# Patient Record
Sex: Female | Born: 1965 | Race: Black or African American | Hispanic: No | Marital: Married | Smoking: Never smoker
Health system: Southern US, Community
[De-identification: ages and names within clinical notes are randomized; demographics above are authoritative.]

## PROBLEM LIST (undated history)

## (undated) DIAGNOSIS — I10 Essential (primary) hypertension: Secondary | ICD-10-CM

## (undated) DIAGNOSIS — R7303 Prediabetes: Secondary | ICD-10-CM

## (undated) DIAGNOSIS — M1711 Unilateral primary osteoarthritis, right knee: Secondary | ICD-10-CM

## (undated) DIAGNOSIS — L680 Hirsutism: Secondary | ICD-10-CM

## (undated) DIAGNOSIS — M2550 Pain in unspecified joint: Secondary | ICD-10-CM

## (undated) DIAGNOSIS — B009 Herpesviral infection, unspecified: Secondary | ICD-10-CM

## (undated) DIAGNOSIS — E669 Obesity, unspecified: Secondary | ICD-10-CM

## (undated) DIAGNOSIS — E282 Polycystic ovarian syndrome: Secondary | ICD-10-CM

## (undated) HISTORY — DX: Essential (primary) hypertension: I10

## (undated) HISTORY — DX: Prediabetes: R73.03

## (undated) HISTORY — PX: WISDOM TOOTH EXTRACTION: SHX21

## (undated) HISTORY — DX: Polycystic ovarian syndrome: E28.2

## (undated) HISTORY — DX: Herpesviral infection, unspecified: B00.9

## (undated) HISTORY — DX: Pain in unspecified joint: M25.50

## (undated) HISTORY — DX: Hirsutism: L68.0

## (undated) HISTORY — DX: Unilateral primary osteoarthritis, right knee: M17.11

## (undated) HISTORY — DX: Obesity, unspecified: E66.9

---

## 2000-07-06 ENCOUNTER — Other Ambulatory Visit: Admission: RE | Admit: 2000-07-06 | Discharge: 2000-07-06 | Payer: Self-pay | Admitting: Obstetrics and Gynecology

## 2001-08-09 ENCOUNTER — Other Ambulatory Visit: Admission: RE | Admit: 2001-08-09 | Discharge: 2001-08-09 | Payer: Self-pay | Admitting: Obstetrics and Gynecology

## 2002-09-20 ENCOUNTER — Other Ambulatory Visit: Admission: RE | Admit: 2002-09-20 | Discharge: 2002-09-20 | Payer: Self-pay | Admitting: Obstetrics and Gynecology

## 2002-09-29 ENCOUNTER — Ambulatory Visit (HOSPITAL_COMMUNITY): Admission: RE | Admit: 2002-09-29 | Discharge: 2002-09-29 | Payer: Self-pay | Admitting: Obstetrics and Gynecology

## 2002-09-29 ENCOUNTER — Encounter: Payer: Self-pay | Admitting: Obstetrics and Gynecology

## 2003-09-20 ENCOUNTER — Other Ambulatory Visit: Admission: RE | Admit: 2003-09-20 | Discharge: 2003-09-20 | Payer: Self-pay | Admitting: Obstetrics and Gynecology

## 2003-11-20 ENCOUNTER — Ambulatory Visit (HOSPITAL_COMMUNITY): Admission: RE | Admit: 2003-11-20 | Discharge: 2003-11-20 | Payer: Self-pay | Admitting: Obstetrics and Gynecology

## 2003-12-07 ENCOUNTER — Inpatient Hospital Stay (HOSPITAL_COMMUNITY): Admission: AD | Admit: 2003-12-07 | Discharge: 2003-12-07 | Payer: Self-pay | Admitting: Obstetrics and Gynecology

## 2004-02-28 ENCOUNTER — Ambulatory Visit (HOSPITAL_COMMUNITY): Admission: RE | Admit: 2004-02-28 | Discharge: 2004-02-28 | Payer: Self-pay | Admitting: Obstetrics and Gynecology

## 2004-03-06 ENCOUNTER — Ambulatory Visit (HOSPITAL_COMMUNITY): Admission: RE | Admit: 2004-03-06 | Discharge: 2004-03-06 | Payer: Self-pay | Admitting: Obstetrics and Gynecology

## 2004-03-10 ENCOUNTER — Ambulatory Visit (HOSPITAL_COMMUNITY): Admission: RE | Admit: 2004-03-10 | Discharge: 2004-03-10 | Payer: Self-pay | Admitting: Obstetrics and Gynecology

## 2004-03-18 ENCOUNTER — Encounter: Admission: RE | Admit: 2004-03-18 | Discharge: 2004-04-04 | Payer: Self-pay | Admitting: Obstetrics and Gynecology

## 2004-03-27 ENCOUNTER — Ambulatory Visit (HOSPITAL_COMMUNITY): Admission: RE | Admit: 2004-03-27 | Discharge: 2004-03-27 | Payer: Self-pay | Admitting: Obstetrics and Gynecology

## 2004-04-01 ENCOUNTER — Ambulatory Visit (HOSPITAL_COMMUNITY): Admission: RE | Admit: 2004-04-01 | Discharge: 2004-04-01 | Payer: Self-pay | Admitting: Obstetrics and Gynecology

## 2004-04-04 ENCOUNTER — Inpatient Hospital Stay (HOSPITAL_COMMUNITY): Admission: RE | Admit: 2004-04-04 | Discharge: 2004-04-07 | Payer: Self-pay | Admitting: Obstetrics and Gynecology

## 2004-10-08 ENCOUNTER — Other Ambulatory Visit: Admission: RE | Admit: 2004-10-08 | Discharge: 2004-10-08 | Payer: Self-pay | Admitting: Obstetrics and Gynecology

## 2005-11-23 ENCOUNTER — Other Ambulatory Visit: Admission: RE | Admit: 2005-11-23 | Discharge: 2005-11-23 | Payer: Self-pay | Admitting: Obstetrics and Gynecology

## 2005-12-22 IMAGING — US US FETAL BPP W/O NONSTRESS
1 series · 14 of 18 positions shown · non-contrast
Comparison: none

CLINICAL DATA: Nonreactive NST with hypertension.  Assess fetal well-being.

[Series 1: us fetal bpp w/o nonstress · 0.37mm/px · 14 of 18 slices shown]
[im 1/18]
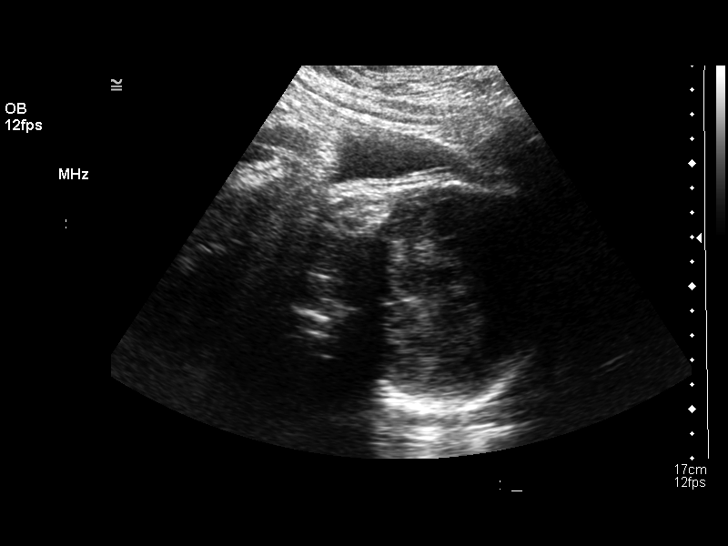
[im 2/18]
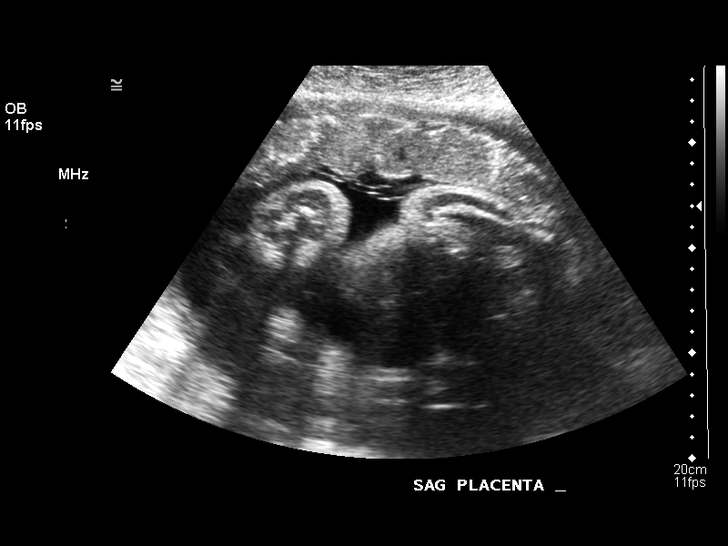
[im 4/18]
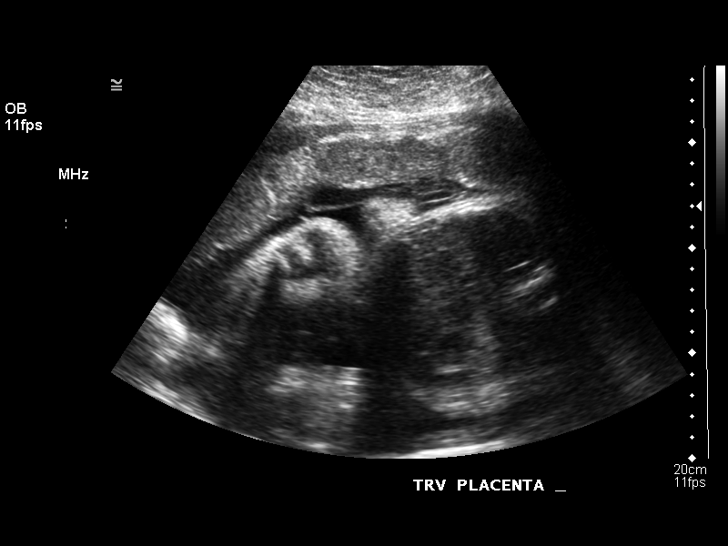
[im 5/18]
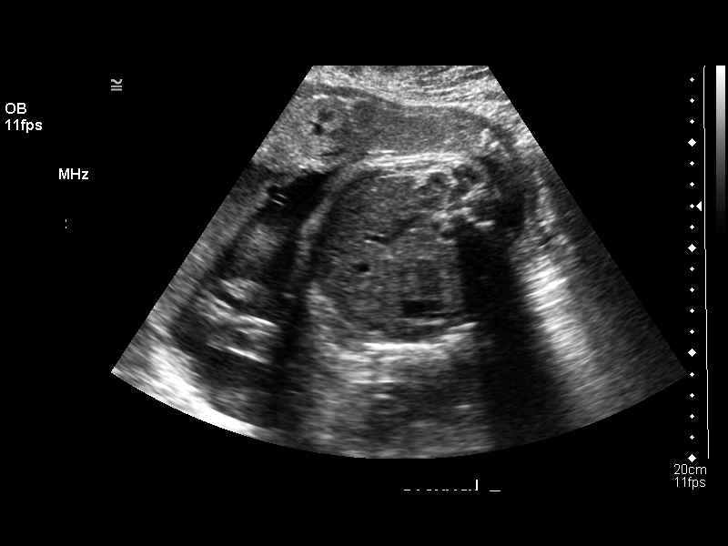
[im 6/18]
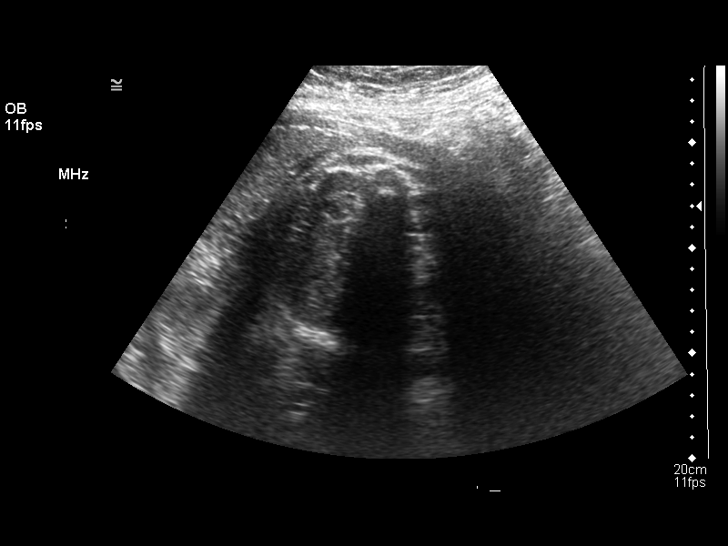
[im 8/18]
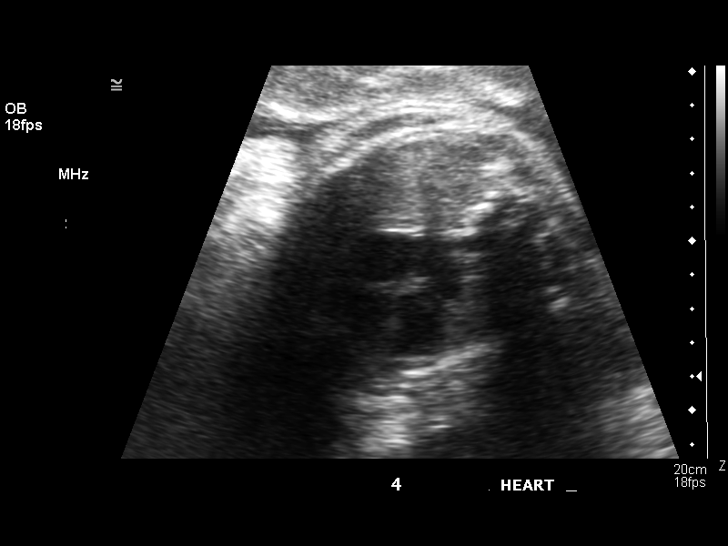
[im 9/18]
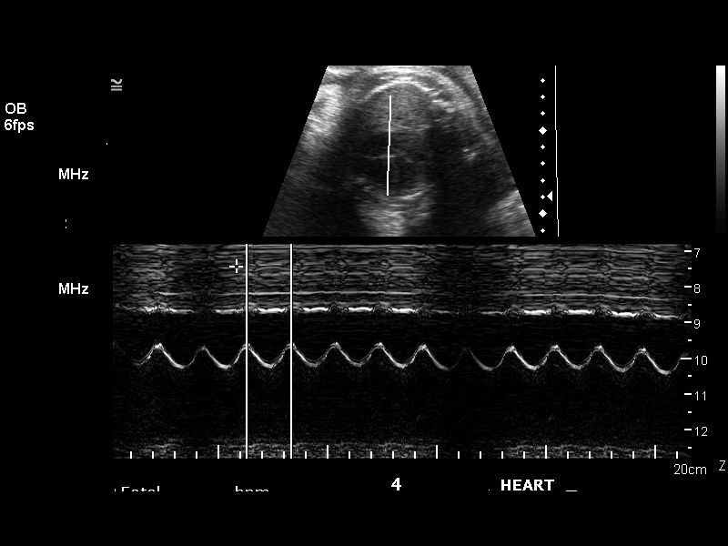
[im 10/18]
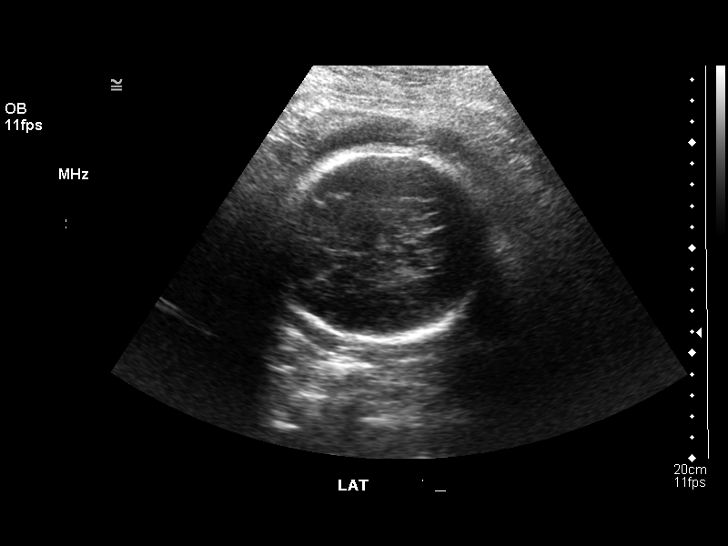
[im 11/18]
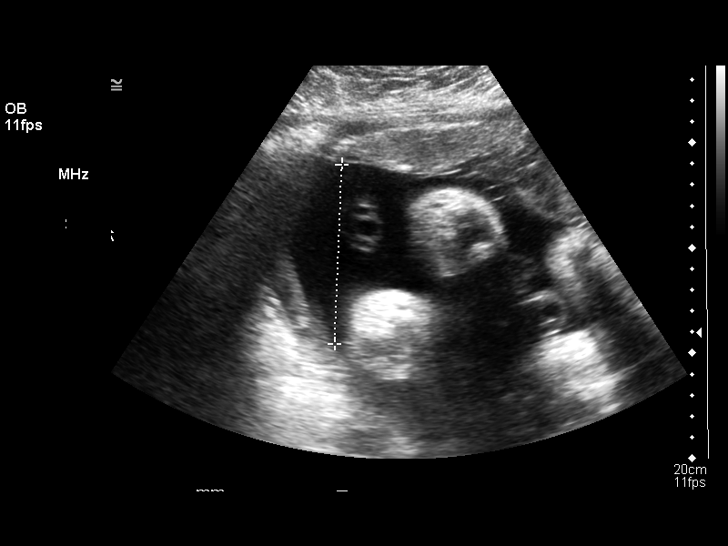
[im 13/18]
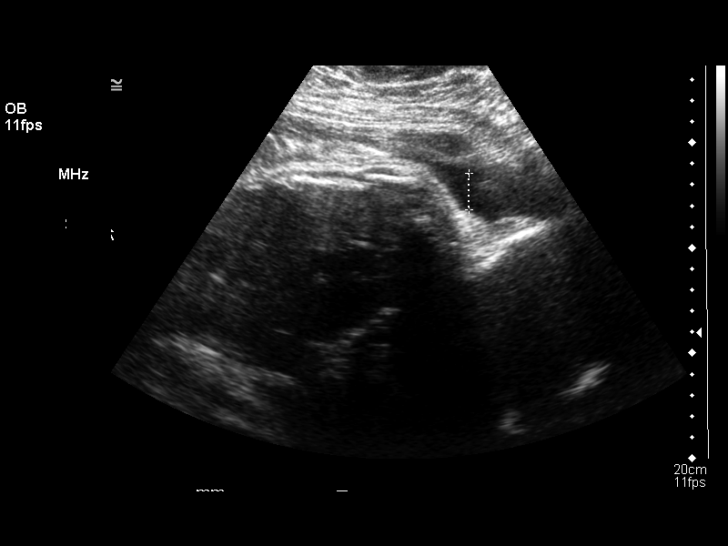
[im 14/18]
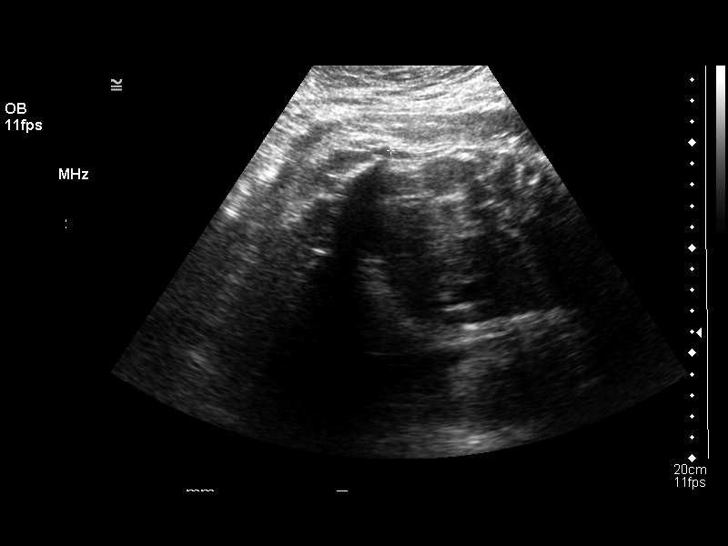
[im 15/18]
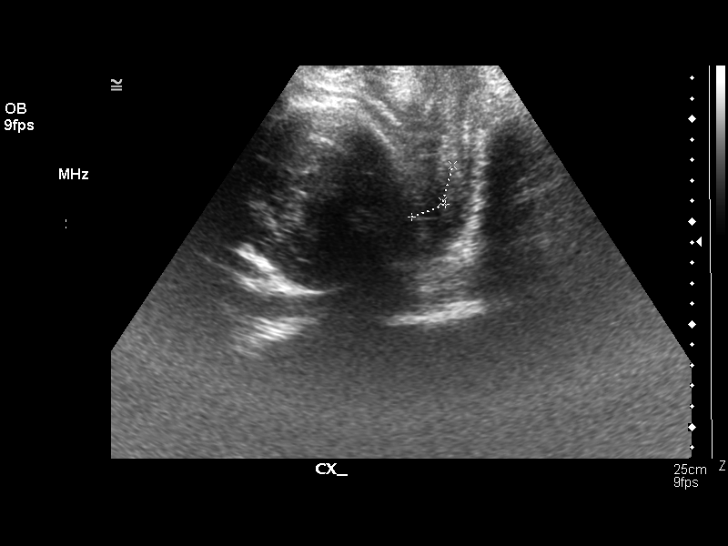
[im 17/18]
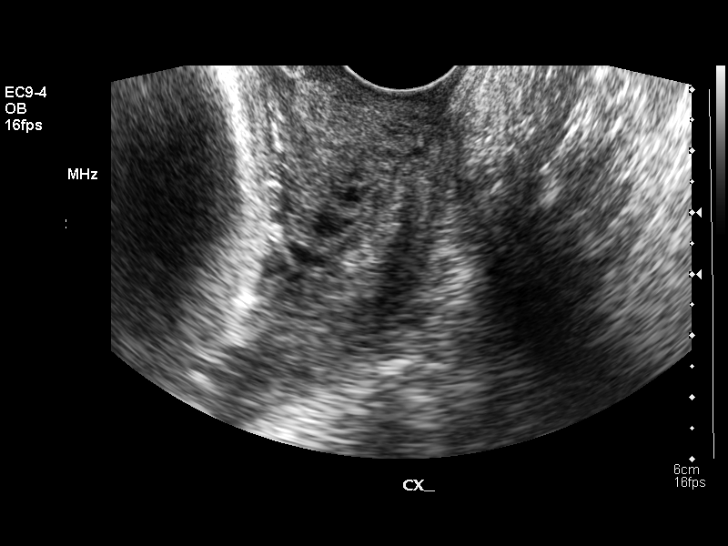
[im 18/18]
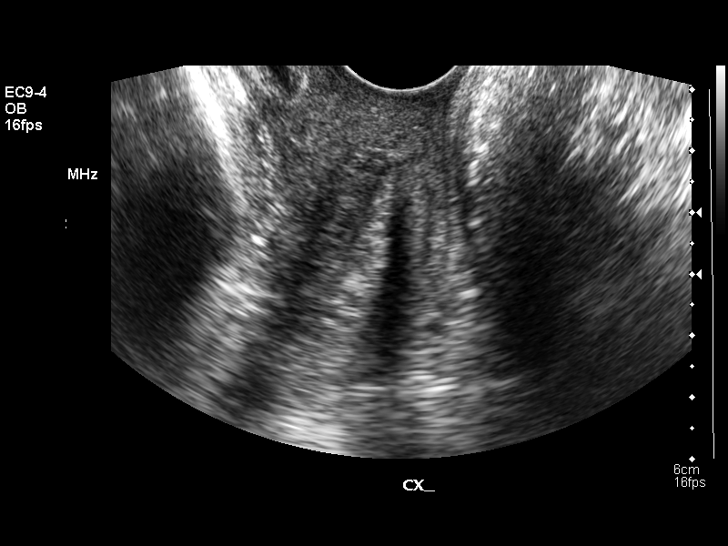

[14 of 18 positions shown; findings below may reference images not displayed]

BIOPHYSICAL PROFILE WITH TRANSVAGINAL:

Number of Fetuses:  1
Heart rate:  147
Movement:  Yes
Breathing:  Yes
Presentation:  Cephalic
Placental Location:  Anterior
Grade:  III
Previa:  No
Amniotic Fluid   (Subjective):  Normal
Amniotic Fluid (Objective):  13.3 cm AFI (5th -95th%ile = 7.9 ? 24.9 cm for 35 wks)

Fetal measurements and complete anatomic evaluation were not requested.  The following fetal anatomy was visualized on this exam:  Lateral ventricles, four chamber heart, stomach, kidneys and bladder. 

BPP SCORING
Movements:  2  Time:  20 minutes
Breathing:  2
Tone:  2
Amniotic Fluid:  2
Total Score:  8

MATERNAL UTERINE AND ADNEXAL FINDINGS
Cervix:  3.0 cm Transvaginally
IMPRESSION: 1.  Biophysical profile score [DATE].
2.  Subjectively and quantitatively normal amniotic fluid volume and normal cervical length.
3.  No late developing fetal anatomic abnormalities are identified associated with the lateral ventricles, four chamber heart, stomach, kidneys or bladder.

## 2005-12-26 IMAGING — US US FETAL BPP W/O NONSTRESS
1 series · 14 of 23 positions shown · non-contrast
Comparison: none

BIOPHYSICAL PROFILE

 Number of Fetuses:  1
 Heart rate:  157
 Movement:  Yes
 Breathing:  No
 Presentation:  Cephalic
 Placental Location:  Anterior
 Grade:  III
 Previa:  No
 Amniotic Fluid (Subjective):  Normal
 Amniotic Fluid (Objective):  22.7 cm AFI (5th -95th%ile = 7.9 ? 24.9 cm for 35 wks)
 Fetal measurements and complete anatomic evaluation were not requested.  The following fetal anatomy was visualized on this exam:  Stomach, kidneys and bladder.
 BPP SCORING
 Movements:  2  Time:  30 minutes
 Breathing:  0
 Tone:  2
 Amniotic Fluid:  2
 Total Score:  6
 MATERNAL UTERINE AND ADNEXAL FINDINGS
 Cervix:  3.3 cm Transvaginally

[Series 1: us fetal bpp w/o nonstress · 0.54mm/px · 14 of 23 slices shown]
[im 1/23]
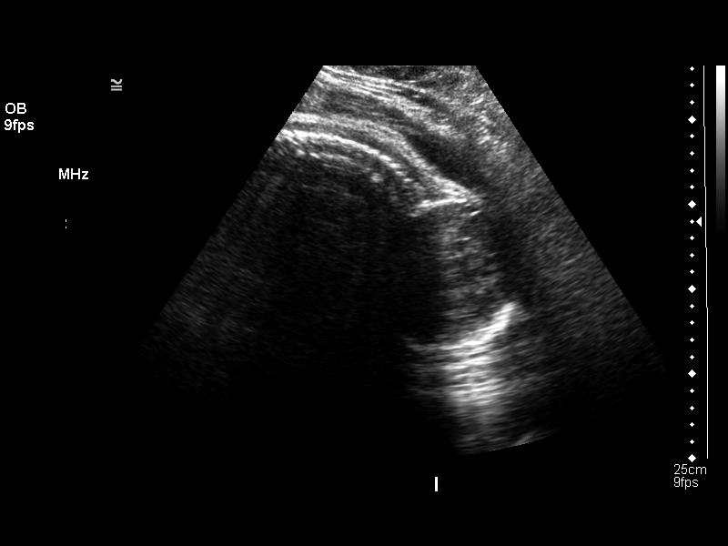
[im 3/23]
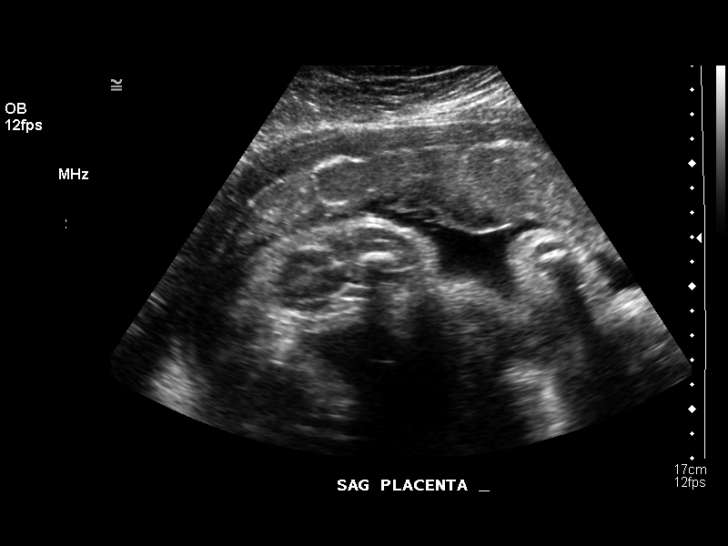
[im 5/23]
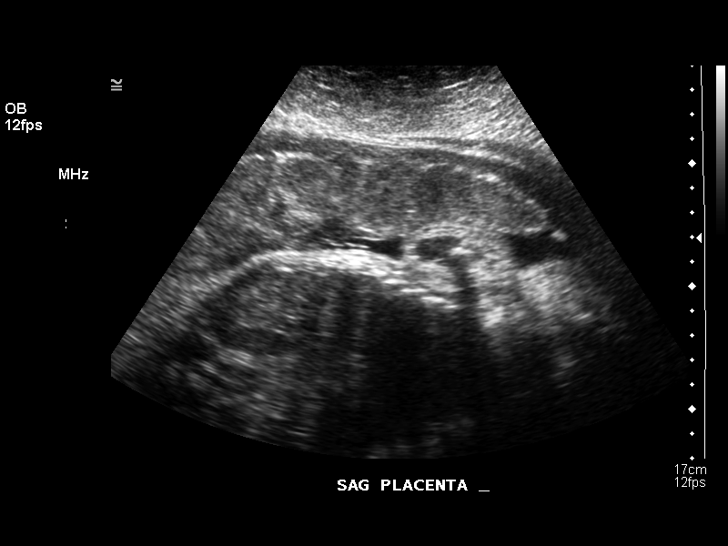
[im 6/23]
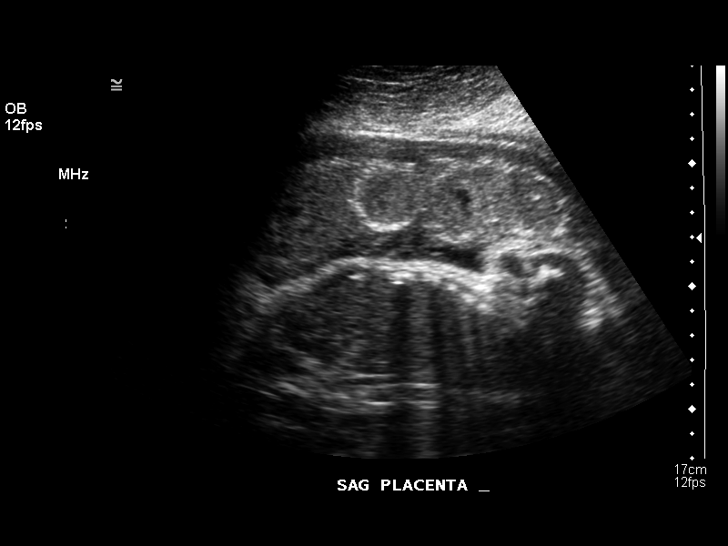
[im 8/23]
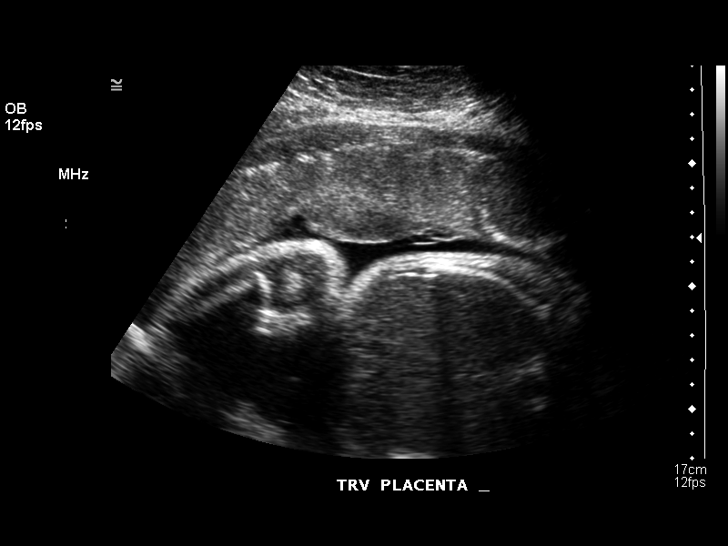
[im 10/23]
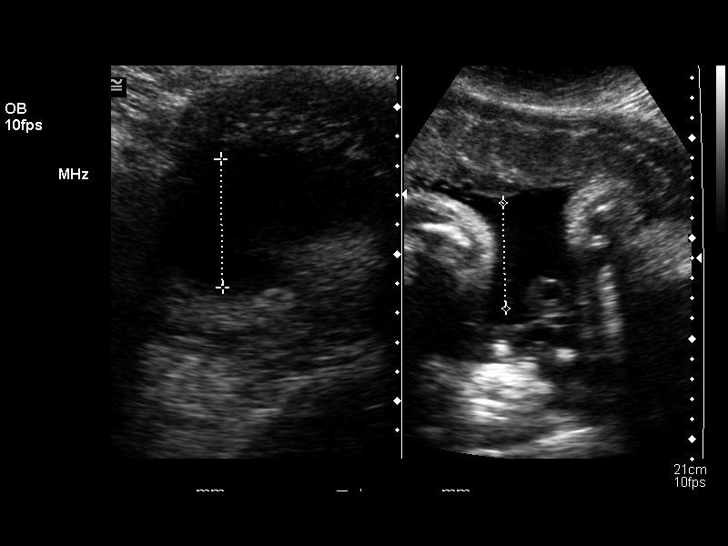
[im 11/23]
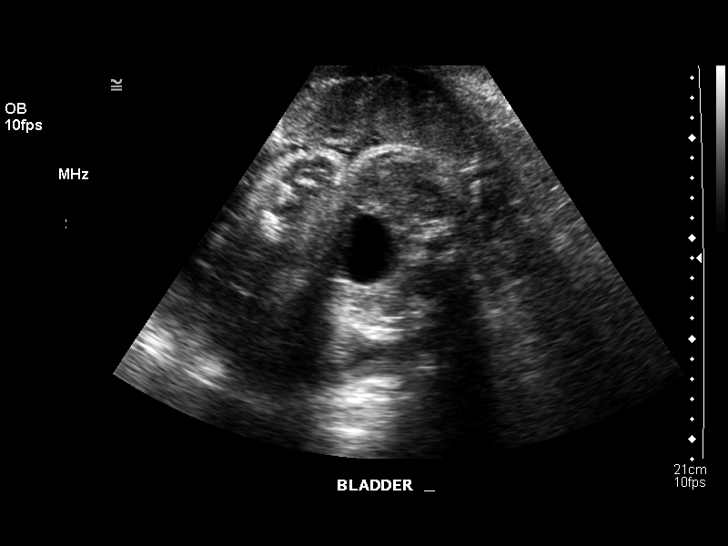
[im 13/23]
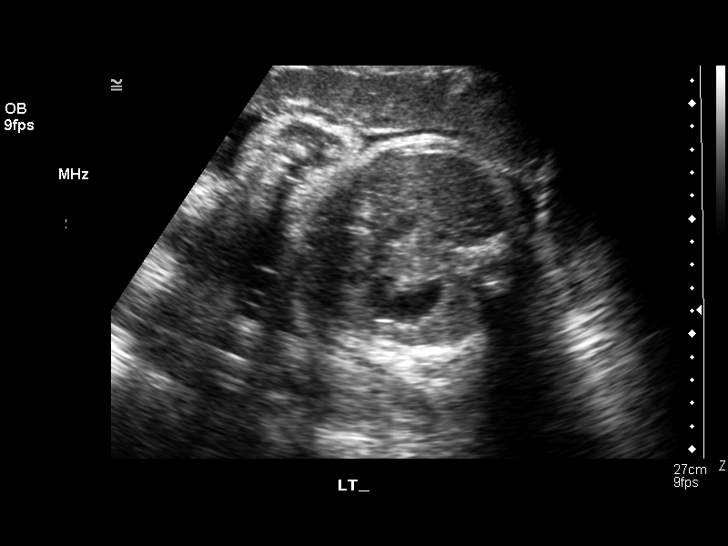
[im 14/23]
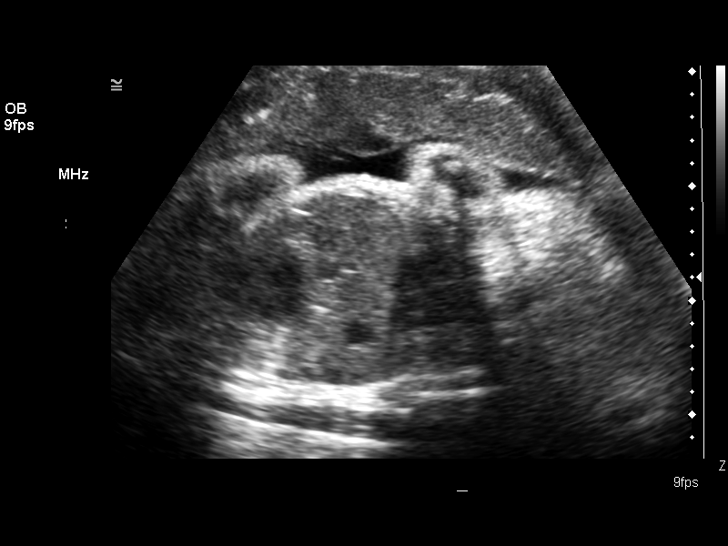
[im 16/23]
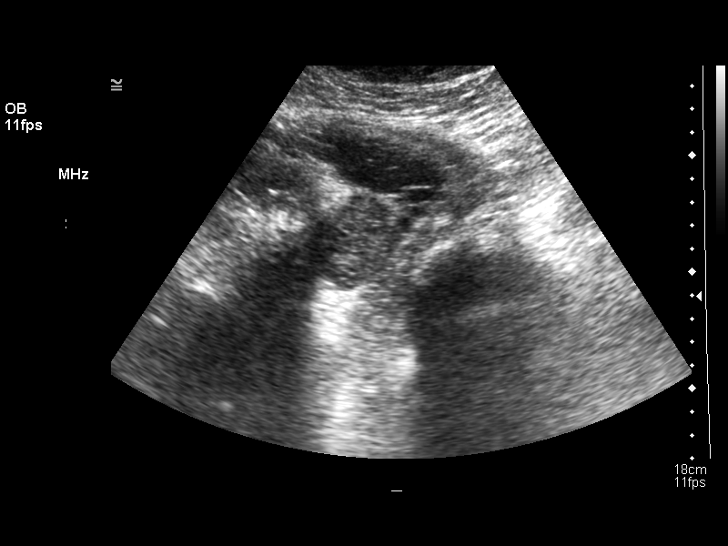
[im 18/23]
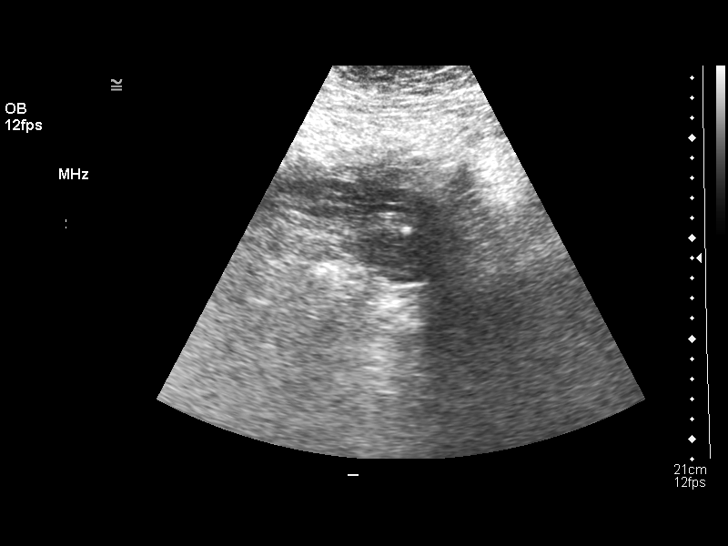
[im 19/23]
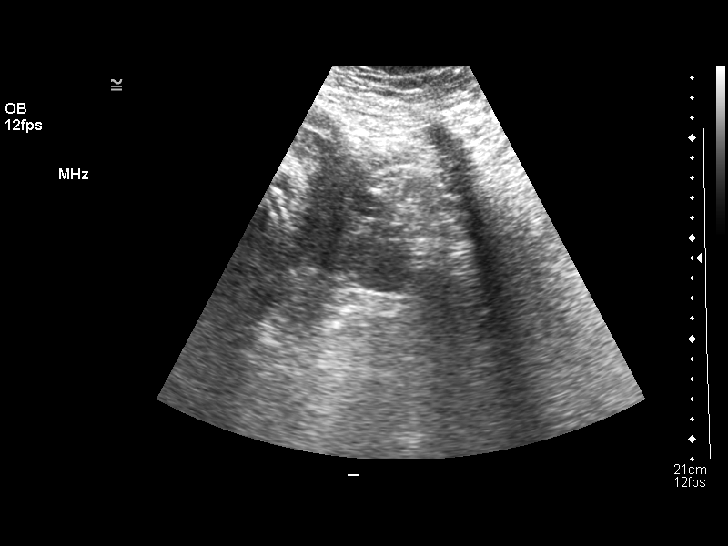
[im 21/23]
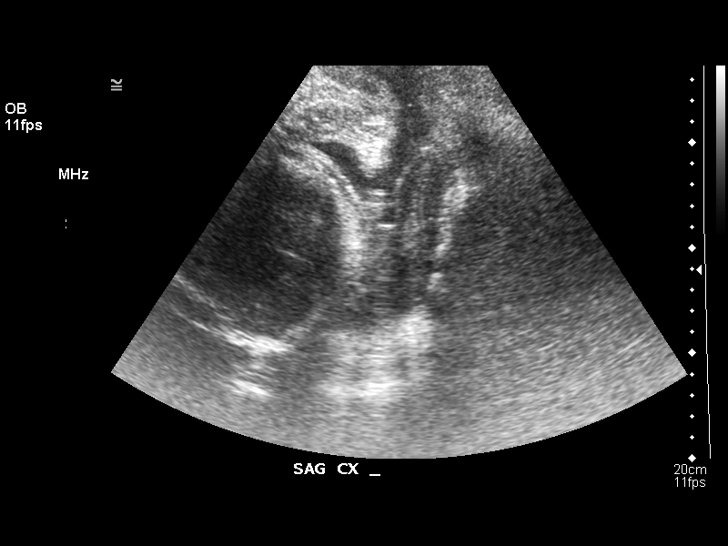
[im 23/23]
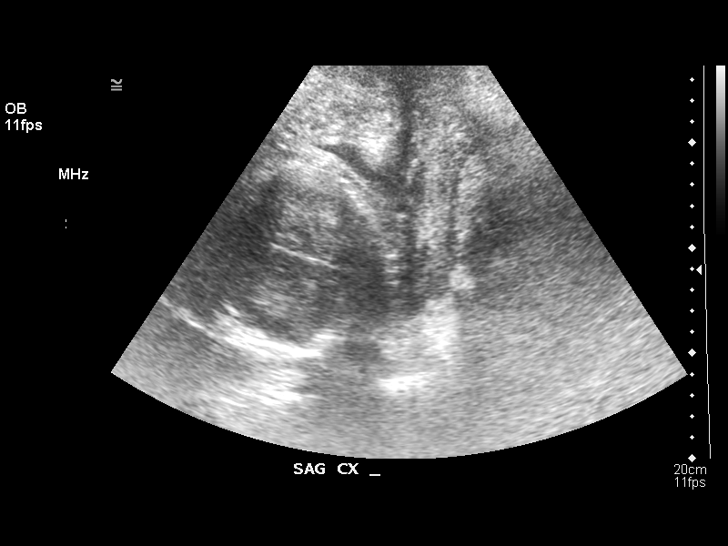

[14 of 23 positions shown; findings below may reference images not displayed]

IMPRESSION: 1.  Single living intrauterine fetus in cephalic presentation with biophysical profile score [DATE] in 30 minutes of scanning.  Two points were deducted for lack of fetal breathing.  
 2.  Normal amniotic fluid volume with AFI of 22.7 cm.

## 2006-01-17 IMAGING — US US FETAL BPP W/O NONSTRESS
1 series · 14 of 20 positions shown · non-contrast
Comparison: none

CLINICAL DATA: 38 week 2 day assigned gestational age by prior ultrasound.  Gestational diabetes.  Evaluate biophysical profile.

[Series 1: us fetal bpp w/o nonstress · 0.39mm/px · 14 of 20 slices shown]
[im 1/20]
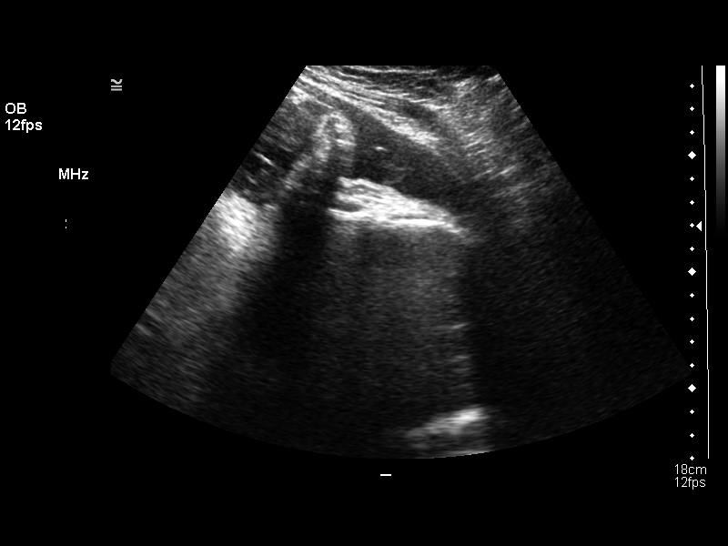
[im 3/20]
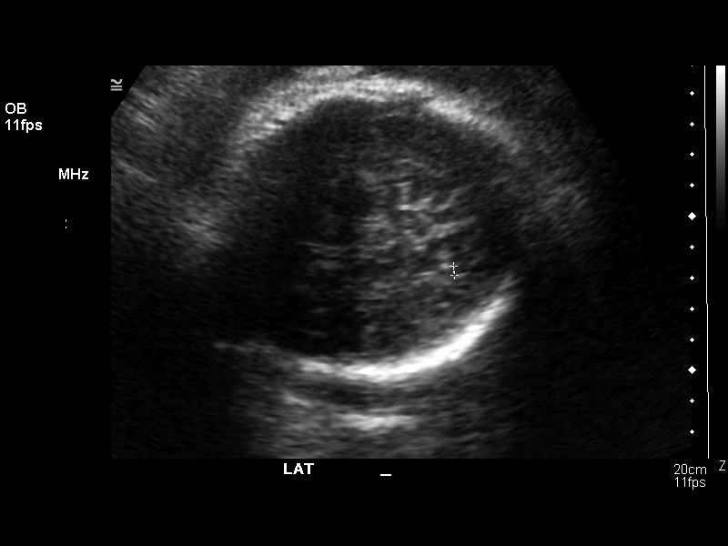
[im 4/20]
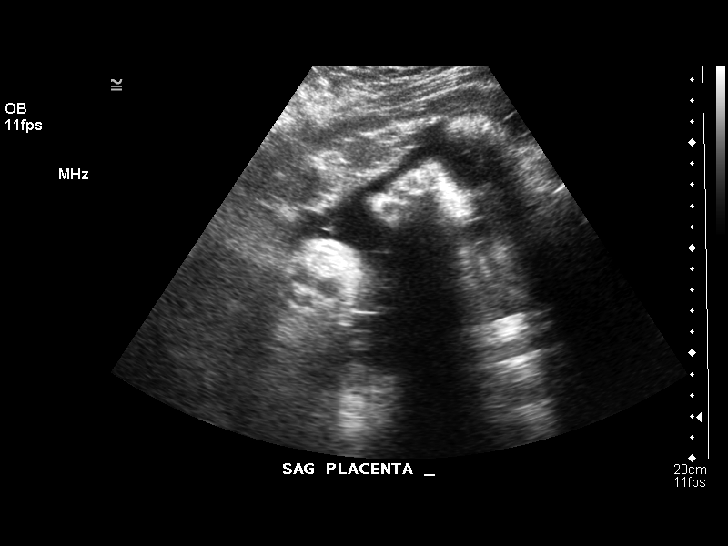
[im 6/20]
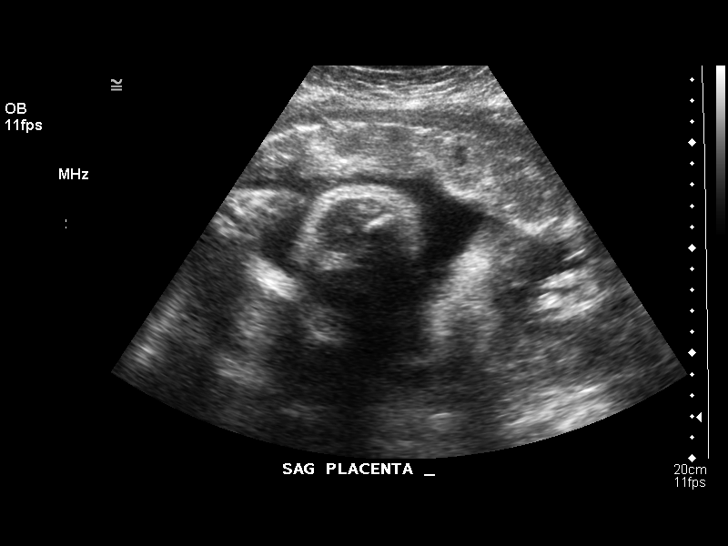
[im 7/20]
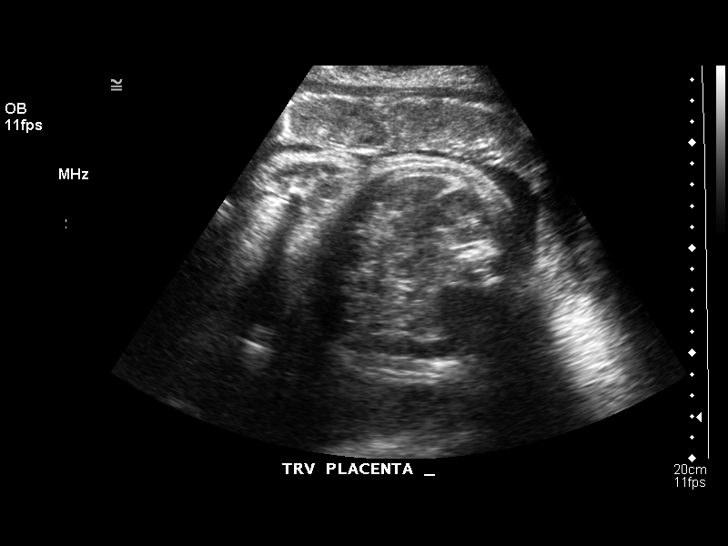
[im 8/20]
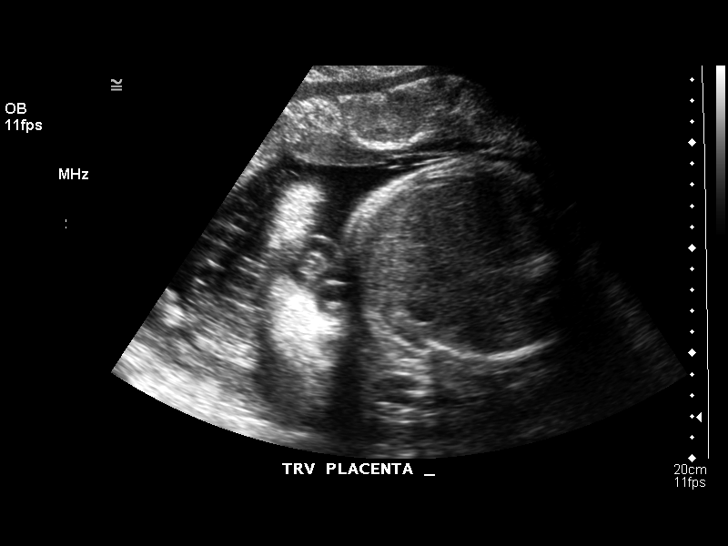
[im 10/20]
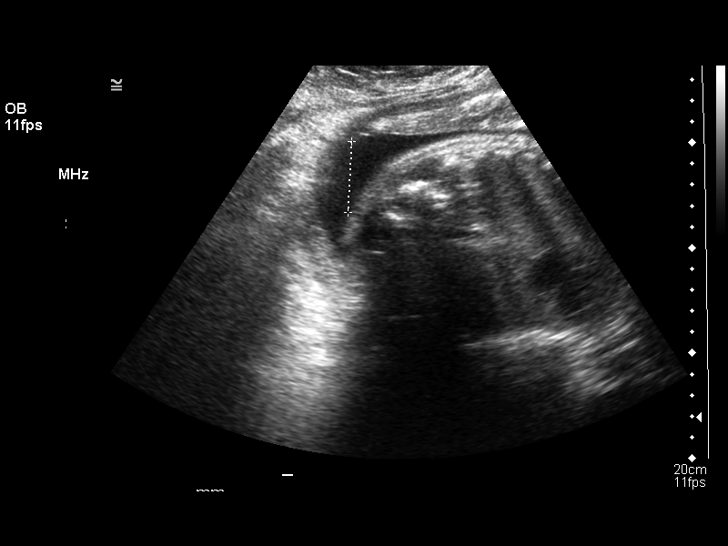
[im 11/20]
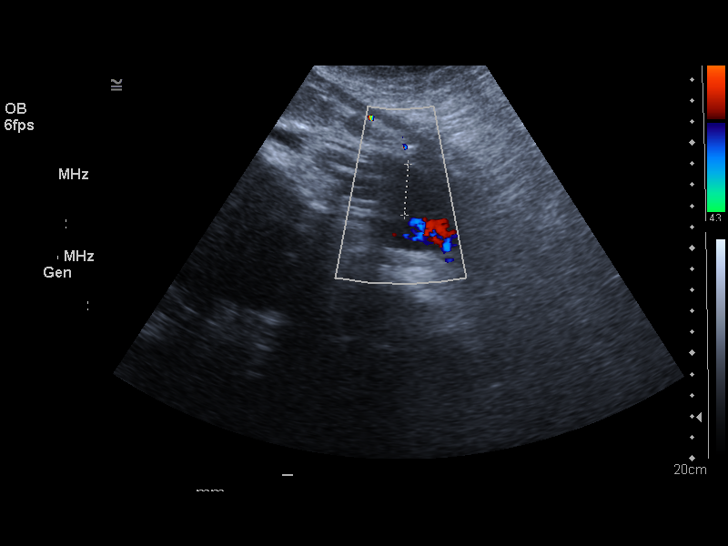
[im 13/20]
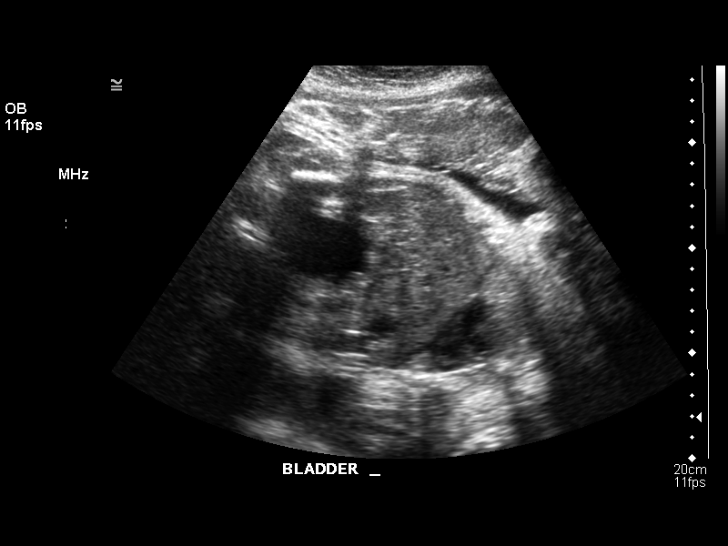
[im 14/20]
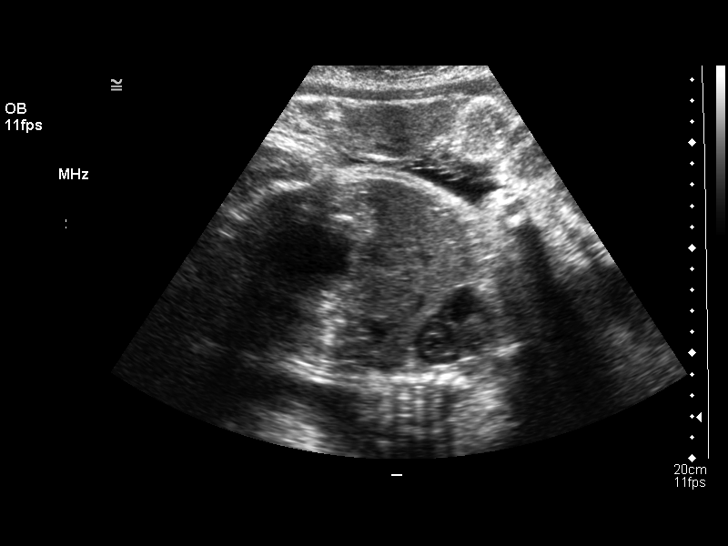
[im 16/20]
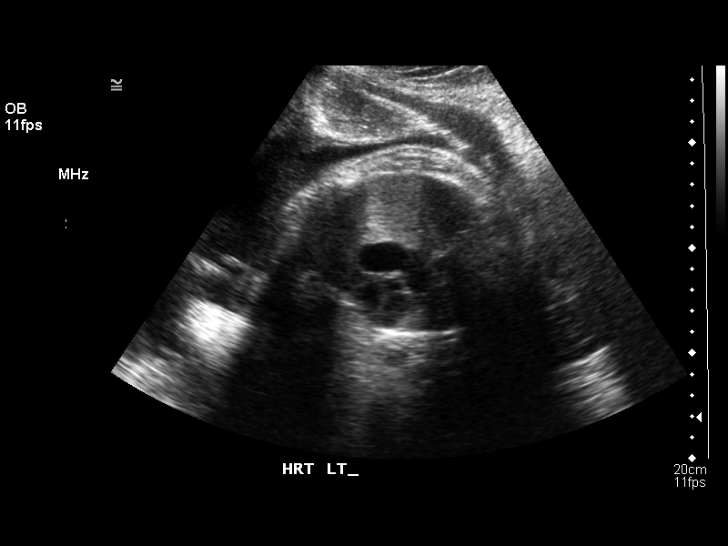
[im 17/20]
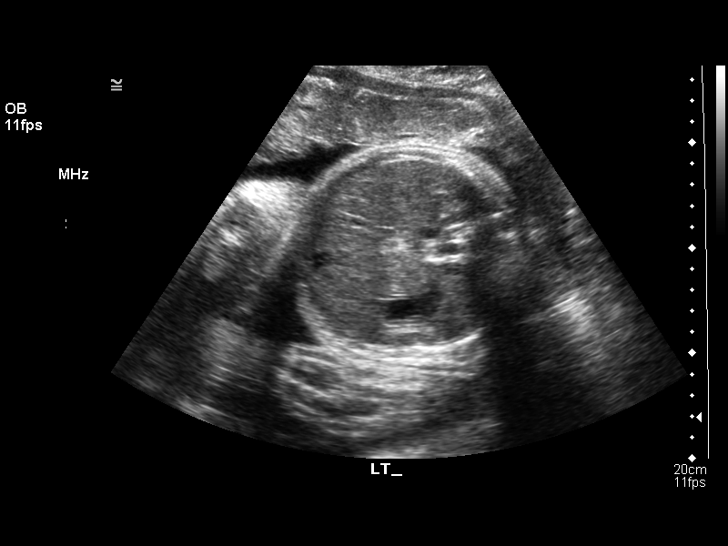
[im 18/20]
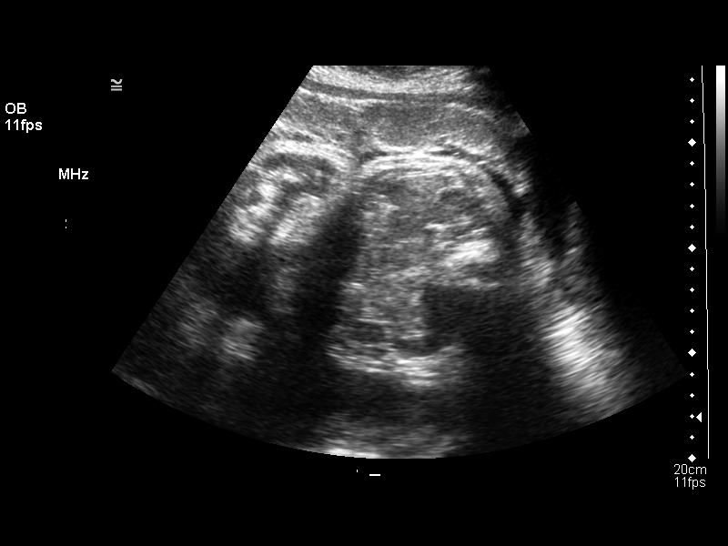
[im 20/20]
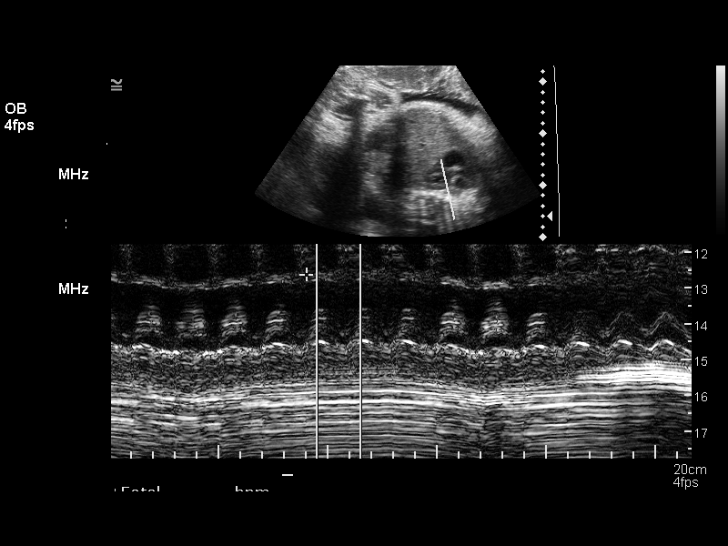

[14 of 20 positions shown; findings below may reference images not displayed]

BIOPHYSICAL PROFILE

 Number of Fetuses:  1
 Heart rate:  150
 Movement:  Yes
 Breathing:  Yes
 Presentation:  Cephalic
 Placental Location:  Anterior
 Grade:  III
 Previa:  No
 Amniotic Fluid (Subjective):  Normal
 Amniotic Fluid (Objective):  13.4 cm AFI (5th -95th%ile = 7.3 ? 23.9 cm for 38 wks)

 Fetal measurements and complete anatomic evaluation were not requested.  The following fetal anatomy was visualized on this exam:  Lateral ventricles, stomach, kidneys and bladder. 

 BPP SCORING
 Movements:  2  Time:  15 minutes
 Breathing:  2
 Tone:  2
 Amniotic Fluid:  2
 Total Score:  8

 MATERNAL UTERINE AND ADNEXAL FINDINGS
 Cervix:  Not evaluated (> 37 wks)
IMPRESSION: Single living intrauterine fetus in cephalic presentation.  Biophysical profile score [DATE].

## 2011-12-28 ENCOUNTER — Ambulatory Visit: Payer: Self-pay | Admitting: Obstetrics and Gynecology

## 2011-12-28 ENCOUNTER — Other Ambulatory Visit: Payer: Self-pay | Admitting: Obstetrics and Gynecology

## 2011-12-28 MED ORDER — VALACYCLOVIR HCL 500 MG PO TABS: 500.0000 mg | ORAL_TABLET | Freq: Every day | ORAL | Status: DC

## 2011-12-28 NOTE — Telephone Encounter (Signed)
Pt called requesting refill of her Valacyclovir until her next annual exam thru her mail order pharmacy.  Pt's AEX scheduled for 02/2011.  Pt e-prescribed a refill of a 3 months supply of rx.

## 2012-02-22 ENCOUNTER — Ambulatory Visit: Payer: Managed Care, Other (non HMO) | Admitting: Obstetrics and Gynecology

## 2012-02-22 ENCOUNTER — Encounter: Payer: Self-pay | Admitting: Obstetrics and Gynecology

## 2012-02-22 VITALS — BP 120/90 | Ht 67.5 in | Wt 296.5 lb

## 2012-02-22 DIAGNOSIS — B009 Herpesviral infection, unspecified: Secondary | ICD-10-CM

## 2012-02-22 DIAGNOSIS — E282 Polycystic ovarian syndrome: Secondary | ICD-10-CM

## 2012-02-22 DIAGNOSIS — L680 Hirsutism: Secondary | ICD-10-CM

## 2012-02-22 DIAGNOSIS — N852 Hypertrophy of uterus: Secondary | ICD-10-CM

## 2012-02-22 DIAGNOSIS — Z124 Encounter for screening for malignant neoplasm of cervix: Secondary | ICD-10-CM

## 2012-02-22 DIAGNOSIS — I10 Essential (primary) hypertension: Secondary | ICD-10-CM

## 2012-02-22 MED ORDER — VALACYCLOVIR HCL 500 MG PO TABS: 500.0000 mg | ORAL_TABLET | Freq: Every day | ORAL | Status: AC

## 2012-02-22 NOTE — Progress Notes (Signed)
Subjective:  AEX  Krisy Rideout is a 47 y.o. female, G1P1, who presents for an annual exam.   Last Pap: 12/27/2008 WNL: Yes Regular Periods:yes Contraception: none  Monthly Breast exam:occ Tetanus<44yrs:? Nl.Bladder Function:yes Daily BMs:yes Healthy Diet:yes Calcium:no Mammogram:yes Date of Mammogram: 08/2011 @ [facility] Exercise:yes Have often Exercise: occ Seatbelt: yes Abuse at home: no Stressful work:no Sigmoid-colonoscopy: n/a Bone Density: No PCP: Dr. Harlow Mares Change in PMH: no change Change in FMH:no change BMI-   History   Social History  . Marital Status: Married    Spouse Name: N/A    Number of Children: N/A  . Years of Education: N/A   Social History Main Topics  . Smoking status: Never Smoker   . Smokeless tobacco: Never Used  . Alcohol Use: Not on file  . Drug Use: Not on file  . Sexually Active: Not on file   Other Topics Concern  . Not on file   Social History Narrative  . No narrative on file    Menstrual cycle:   LMP: Patient's last menstrual period was 02/13/2012.           Cycle: Monthly menses q28-31 for 5-6 days.  No IM bldg.  Cramps vary relieved with Tylenol  The following portions of the patient's history were reviewed and updated as appropriate: allergies, current medications, past family history, past medical history, past social history, past surgical history and problem list.  Review of Systems Pertinent items are noted in HPI. Breast:Negative for breast lump,nipple discharge or nipple retraction Gastrointestinal: Negative for abdominal pain, change in bowel habits or rectal bleeding Urinary:negative   Objective:    LMP 02/13/2012    Weight:  Wt Readings from Last 1 Encounters:  No data found for Wt          BMI: There is no height or weight on file to calculate BMI.  General Appearance: Alert, appropriate appearance for age. No acute distress HEENT: Grossly normal Neck / Thyroid: Supple, no masses, nodes or  enlargement Lungs: clear to auscultation bilaterally Back: No CVA tenderness Breast Exam: No masses or nodes.No dimpling, nipple retraction or discharge. Cardiovascular: Regular rate and rhythm. S1, S2, no murmur Gastrointestinal: Soft, non-tender, no masses or organomegaly. Exam is limited by body habitus Pelvic Exam: External genitalia: normal general appearance Vaginal: normal mucosa without prolapse or lesions Cervix: normal appearance Adnexa: no masses noted Uterus: enlargment suggested, but exam limited Exam limited by body habitus Rectovaginal: normal rectal, no masses Lymphatic Exam: Non-palpable nodes in neck, clavicular, axillary, or inguinal regions Skin: no rash or abnormalities Neurologic: Normal gait and speech, no tremor  Psychiatric: Alert and oriented, appropriate affect.   Wet Prep:not applicable Urinalysis:not applicable UPT: Not done   Assessment:    hx PCOS  lIMITED exam Hx HSVii   Plan:    mammogram pap smear Return for U/S STD screening: declined Contraception:condoms

## 2012-02-23 LAB — PAP IG AND HPV HIGH-RISK: HPV DNA High Risk: NOT DETECTED

## 2012-02-24 ENCOUNTER — Encounter: Payer: Self-pay | Admitting: Obstetrics and Gynecology

## 2012-02-29 ENCOUNTER — Telehealth: Payer: Self-pay | Admitting: Obstetrics and Gynecology

## 2012-02-29 NOTE — Telephone Encounter (Signed)
Returned pt's call. LM to return call.   

## 2012-02-29 NOTE — Telephone Encounter (Signed)
TC from pt.  Questioning if needs repeat Pap at next visit 03/14/12. Explained results showed ASCUS but High Risk HPV undetected. Will send pt info. To discuss any further concerns at [place]. Pt verbalizes comprehension.

## 2012-03-14 ENCOUNTER — Ambulatory Visit: Payer: Managed Care, Other (non HMO) | Admitting: Obstetrics and Gynecology

## 2012-03-14 ENCOUNTER — Encounter: Payer: Self-pay | Admitting: Obstetrics and Gynecology

## 2012-03-14 ENCOUNTER — Ambulatory Visit: Payer: Managed Care, Other (non HMO)

## 2012-03-14 ENCOUNTER — Other Ambulatory Visit: Payer: Self-pay | Admitting: Obstetrics and Gynecology

## 2012-03-14 VITALS — BP 140/84 | Wt 301.0 lb

## 2012-03-14 DIAGNOSIS — N852 Hypertrophy of uterus: Secondary | ICD-10-CM

## 2012-03-14 NOTE — Progress Notes (Signed)
GYN PROBLEM VISIT  Ms. April Norton is a 47 y.o. year old female,G1P1, who presents for a problem visit.   Subjective:  Pt here for question of enlarged uterus on exam at aex which was compromised by pt habitus. She has question about her pap result of ASCUS with negative HR HPV.  She has never had an abnormality before.  Objective:  BP 140/84  Wt 301 lb (136.533 kg)  BMI 46.42 kg/m2  LMP 02/13/2012    ULTRASOUND: Uterus: Length: 6.99 cm   Width:  5.18 cm   Height:  5.09 cm  Endo thickness:  0.713 cm   Left ovary:Normal Right ovary:Normal  CDS fluid:no  Comment: Anteverted uterus-C sect scar noted. Otherwise-wnl's. Endometrium-wnl's-Normal cavity shape by 3D images. Adnexa are wnl's.   Asessment: Normal U/S findings ASCUS with neg HR HPV   Plan: Repeat pap and exam 1 year   Dierdre Forth, MD  03/14/2012 5:32 PM

## 2013-05-10 ENCOUNTER — Other Ambulatory Visit: Payer: Self-pay | Admitting: Obstetrics and Gynecology

## 2013-05-10 DIAGNOSIS — N6320 Unspecified lump in the left breast, unspecified quadrant: Secondary | ICD-10-CM

## 2013-05-10 DIAGNOSIS — N644 Mastodynia: Secondary | ICD-10-CM

## 2013-11-20 ENCOUNTER — Encounter: Payer: Self-pay | Admitting: Obstetrics and Gynecology

## 2014-12-21 DIAGNOSIS — Z833 Family history of diabetes mellitus: Secondary | ICD-10-CM | POA: Insufficient documentation

## 2017-01-05 DIAGNOSIS — E282 Polycystic ovarian syndrome: Secondary | ICD-10-CM | POA: Insufficient documentation

## 2017-01-08 DIAGNOSIS — E559 Vitamin D deficiency, unspecified: Secondary | ICD-10-CM | POA: Insufficient documentation

## 2017-01-08 DIAGNOSIS — R7303 Prediabetes: Secondary | ICD-10-CM | POA: Insufficient documentation

## 2017-11-04 DIAGNOSIS — D126 Benign neoplasm of colon, unspecified: Secondary | ICD-10-CM | POA: Insufficient documentation

## 2020-02-01 ENCOUNTER — Ambulatory Visit (INDEPENDENT_AMBULATORY_CARE_PROVIDER_SITE_OTHER): Payer: Self-pay | Admitting: Family Medicine

## 2020-02-15 ENCOUNTER — Ambulatory Visit (INDEPENDENT_AMBULATORY_CARE_PROVIDER_SITE_OTHER): Payer: Self-pay | Admitting: Family Medicine

## 2020-03-07 ENCOUNTER — Ambulatory Visit (INDEPENDENT_AMBULATORY_CARE_PROVIDER_SITE_OTHER): Payer: 59 | Admitting: Family Medicine

## 2020-03-07 ENCOUNTER — Other Ambulatory Visit: Payer: Self-pay

## 2020-03-07 ENCOUNTER — Encounter (INDEPENDENT_AMBULATORY_CARE_PROVIDER_SITE_OTHER): Payer: Self-pay | Admitting: Family Medicine

## 2020-03-07 VITALS — BP 154/82 | HR 83 | Temp 98.2°F | Ht 68.0 in | Wt 331.0 lb

## 2020-03-07 DIAGNOSIS — R632 Polyphagia: Secondary | ICD-10-CM

## 2020-03-07 DIAGNOSIS — R948 Abnormal results of function studies of other organs and systems: Secondary | ICD-10-CM | POA: Diagnosis not present

## 2020-03-07 DIAGNOSIS — Z9189 Other specified personal risk factors, not elsewhere classified: Secondary | ICD-10-CM | POA: Diagnosis not present

## 2020-03-07 DIAGNOSIS — E65 Localized adiposity: Secondary | ICD-10-CM | POA: Diagnosis not present

## 2020-03-07 DIAGNOSIS — R0602 Shortness of breath: Secondary | ICD-10-CM

## 2020-03-07 DIAGNOSIS — R5383 Other fatigue: Secondary | ICD-10-CM

## 2020-03-07 DIAGNOSIS — F3289 Other specified depressive episodes: Secondary | ICD-10-CM | POA: Diagnosis not present

## 2020-03-07 DIAGNOSIS — E7800 Pure hypercholesterolemia, unspecified: Secondary | ICD-10-CM

## 2020-03-07 DIAGNOSIS — R0683 Snoring: Secondary | ICD-10-CM

## 2020-03-07 DIAGNOSIS — Z1331 Encounter for screening for depression: Secondary | ICD-10-CM | POA: Diagnosis not present

## 2020-03-07 DIAGNOSIS — M1711 Unilateral primary osteoarthritis, right knee: Secondary | ICD-10-CM

## 2020-03-07 DIAGNOSIS — R7303 Prediabetes: Secondary | ICD-10-CM

## 2020-03-07 DIAGNOSIS — Z6841 Body Mass Index (BMI) 40.0 and over, adult: Secondary | ICD-10-CM

## 2020-03-07 DIAGNOSIS — E282 Polycystic ovarian syndrome: Secondary | ICD-10-CM

## 2020-03-07 DIAGNOSIS — I10 Essential (primary) hypertension: Secondary | ICD-10-CM

## 2020-03-07 DIAGNOSIS — Z0289 Encounter for other administrative examinations: Secondary | ICD-10-CM

## 2020-03-07 DIAGNOSIS — N951 Menopausal and female climacteric states: Secondary | ICD-10-CM

## 2020-03-07 MED ORDER — WEGOVY 0.25 MG/0.5ML ~~LOC~~ SOAJ: 0.2500 mg | SUBCUTANEOUS | 0 refills | Status: DC

## 2020-03-08 LAB — LIPID PANEL
Chol/HDL Ratio: 3.6 ratio (ref 0.0–4.4)
Cholesterol, Total: 213 mg/dL — ABNORMAL HIGH (ref 100–199)
HDL: 60 mg/dL (ref >39)
LDL Chol Calc (NIH): 138 mg/dL — ABNORMAL HIGH (ref 0–99)
Triglycerides: 85 mg/dL (ref 0–149)
VLDL Cholesterol Cal: 15 mg/dL (ref 5–40)

## 2020-03-08 LAB — CBC WITH DIFFERENTIAL/PLATELET
Basophils Absolute: 0 10*3/uL (ref 0.0–0.2)
Basos: 1 %
EOS (ABSOLUTE): 0.2 10*3/uL (ref 0.0–0.4)
Eos: 3 %
Hemoglobin: 14.7 g/dL (ref 11.1–15.9)
Immature Grans (Abs): 0 10*3/uL (ref 0.0–0.1)
Immature Granulocytes: 0 %
Lymphocytes Absolute: 2.9 10*3/uL (ref 0.7–3.1)
Lymphs: 41 %
MCH: 28.3 pg (ref 26.6–33.0)
MCHC: 32.9 g/dL (ref 31.5–35.7)
MCV: 86 fL (ref 79–97)
Monocytes Absolute: 0.5 10*3/uL (ref 0.1–0.9)
Monocytes: 6 %
Neutrophils Absolute: 3.4 10*3/uL (ref 1.4–7.0)
Neutrophils: 49 %
Platelets: 280 10*3/uL (ref 150–450)
RBC: 5.2 x10E6/uL (ref 3.77–5.28)
RDW: 14.2 % (ref 11.7–15.4)
WBC: 7 10*3/uL (ref 3.4–10.8)

## 2020-03-08 LAB — ANEMIA PANEL
Ferritin: 205 ng/mL — ABNORMAL HIGH (ref 15–150)
Folate, Hemolysate: 484 ng/mL
Folate, RBC: 1083 ng/mL (ref >498)
Hematocrit: 44.7 % (ref 34.0–46.6)
Iron Saturation: 23 % (ref 15–55)
Iron: 89 ug/dL (ref 27–159)
Retic Ct Pct: 2.1 % (ref 0.6–2.6)
Total Iron Binding Capacity: 392 ug/dL (ref 250–450)
UIBC: 303 ug/dL (ref 131–425)
Vitamin B-12: >2000 pg/mL — ABNORMAL HIGH (ref 232–1245)

## 2020-03-08 LAB — COMPREHENSIVE METABOLIC PANEL
ALT: 16 IU/L (ref 0–32)
AST: 16 IU/L (ref 0–40)
Albumin/Globulin Ratio: 1.6 (ref 1.2–2.2)
Albumin: 4.7 g/dL (ref 3.8–4.9)
Alkaline Phosphatase: 119 IU/L (ref 44–121)
BUN/Creatinine Ratio: 22 (ref 9–23)
BUN: 17 mg/dL (ref 6–24)
Bilirubin Total: 0.4 mg/dL (ref 0.0–1.2)
CO2: 20 mmol/L (ref 20–29)
Calcium: 9.7 mg/dL (ref 8.7–10.2)
Chloride: 98 mmol/L (ref 96–106)
Creatinine, Ser: 0.78 mg/dL (ref 0.57–1.00)
GFR calc Af Amer: 100 mL/min/{1.73_m2} (ref >59)
GFR calc non Af Amer: 87 mL/min/{1.73_m2} (ref >59)
Globulin, Total: 3 g/dL (ref 1.5–4.5)
Glucose: 88 mg/dL (ref 65–99)
Potassium: 4.3 mmol/L (ref 3.5–5.2)
Sodium: 141 mmol/L (ref 134–144)
Total Protein: 7.7 g/dL (ref 6.0–8.5)

## 2020-03-08 LAB — TSH: TSH: 1.3 u[IU]/mL (ref 0.450–4.500)

## 2020-03-08 LAB — INSULIN, RANDOM: INSULIN: 17.9 u[IU]/mL (ref 2.6–24.9)

## 2020-03-08 LAB — T4, FREE: Free T4: 1.35 ng/dL (ref 0.82–1.77)

## 2020-03-08 LAB — HEMOGLOBIN A1C
Est. average glucose Bld gHb Est-mCnc: 128 mg/dL
Hgb A1c MFr Bld: 6.1 % — ABNORMAL HIGH (ref 4.8–5.6)

## 2020-03-08 LAB — VITAMIN D 25 HYDROXY (VIT D DEFICIENCY, FRACTURES): Vit D, 25-Hydroxy: 47.1 ng/mL (ref 30.0–100.0)

## 2020-03-09 ENCOUNTER — Encounter (INDEPENDENT_AMBULATORY_CARE_PROVIDER_SITE_OTHER): Payer: Self-pay | Admitting: Family Medicine

## 2020-03-11 NOTE — Telephone Encounter (Signed)
Dr.Wallace °

## 2020-03-12 NOTE — Progress Notes (Signed)
Chief Complaint:   OBESITY April Norton (MR# [id number]) is a 55 y.o. female who presents for evaluation and treatment of obesity and related comorbidities. Current BMI is Body mass index is 50.33 kg/m. April Norton has been struggling with her weight for many years and has been unsuccessful in either losing weight, maintaining weight loss, or reaching her healthy weight goal.  April Norton is currently in the action stage of change and ready to dedicate time achieving and maintaining a healthier weight. April Norton is interested in becoming our patient and working on intensive lifestyle modifications including (but not limited to) diet and exercise for weight loss.  April Norton says that her challenge is snacks at night while watching TV.  April Norton's habits were reviewed today and are as follows: Her family eats meals together, she thinks her family will eat healthier with her, her desired weight loss is 167 pounds, she has been heavy most of her life, she started gaining weight in the late 90s, she craves sweets, carbs, corn, ice cream, she snacks frequently in the evenings, she skips breakfast frequently, she is frequently drinking liquids with calories, she frequently makes poor food choices, she frequently eats larger portions than normal and she struggles with emotional eating.  Depression Screen April Norton's Food and Mood (modified PHQ-9) score was 10.  Depression screen PHQ 2/9 03/07/2020  Decreased Interest 1  Down, Depressed, Hopeless 1  PHQ - 2 Score 2  Altered sleeping 1  Tired, decreased energy 3  Change in appetite 1  Feeling bad or failure about yourself  3  Trouble concentrating 0  Moving slowly or fidgety/restless 0  Suicidal thoughts 0  PHQ-9 Score 10  Difficult doing work/chores Not difficult at all   Assessment/Plan:   1. Other fatigue April Norton denies daytime somnolence and reports waking up still tired. Patent has a history of symptoms of morning fatigue and snoring. Ilyanna generally gets 6-8  hours of sleep per night, and states that she has generally restful sleep. Snoring is present. Apneic episodes are not present. Epworth Sleepiness Score is 8.  Anylah does feel that her weight is causing her energy to be lower than it should be. Fatigue may be related to obesity, depression or many other causes. Labs will be ordered, and in the meanwhile, Shevawn will focus on self care including making healthy food choices, increasing physical activity and focusing on stress reduction.  - EKG 12-Lead - Anemia panel - CBC with Differential/Platelet - VITAMIN D 25 Hydroxy (Vit-D Deficiency, Fractures) - TSH - T4, free  2. SOB (shortness of breath) on exertion April Norton notes increasing shortness of breath with exercising and seems to be worsening over time with weight gain. She notes getting out of breath sooner with activity than she used to. This has gotten worse recently. Kaitlyne denies shortness of breath at rest or orthopnea.  April Norton does feel that she gets out of breath more easily that she used to when she exercises. April Norton's shortness of breath appears to be obesity related and exercise induced. She has agreed to work on weight loss and gradually increase exercise to treat her exercise induced shortness of breath. Will continue to monitor closely.  - Anemia panel - CBC with Differential/Platelet - VITAMIN D 25 Hydroxy (Vit-D Deficiency, Fractures) - TSH - T4, free  3. Visceral obesity Current visceral fat rating: 20. Visceral fat rating should be < 13. Visceral adipose tissue is a hormonally active component of total body fat. This body composition phenotype is associated with medical  disorders such as metabolic syndrome, cardiovascular disease and several malignancies including prostate, breast, and colorectal cancers. Starting goal: Lose 7-10% of starting weight.   Starting goal: Lose 7-10% of starting weight. She will continue to focus on protein-rich, low simple carbohydrate foods. We  reviewed the importance of hydration, regular exercise for stress reduction, and restorative sleep.  We will continue to check lab work every 3 months, with 10% weight loss, or should any other concerns arise.  4. Abnormal metabolism April Norton's metabolism is slower than normal at 1638 (2216 expected).  - VITAMIN D 25 Hydroxy (Vit-D Deficiency, Fractures)  5. Essential hypertension Elevated today.  Medications: diltiazem 300 mg daily, triamterene-HCTZ 37.5-25 mg daily, valsartan 80 mg daily.   Plan: Avoid buying foods that are: processed, frozen, or prepackaged to avoid excess salt. We will continue to monitor closely alongside her PCP and/or Specialist.  Regular follow up with PCP and specialists was also encouraged.   BP Readings from Last 3 Encounters:  03/07/20 (!) 154/82  03/14/12 140/84  02/22/12 120/90   Lab Results  Component Value Date   CREATININE 0.78 03/07/2020   - CBC with Differential/Platelet - Comprehensive metabolic panel - TSH - T4, free  6. Prediabetes Not at goal. Goal is HgbA1c < 5.7.  A1c was 6.0 (8 months ago).  Medication: None.    Plan:  She will continue to focus on protein-rich, low simple carbohydrate foods. We reviewed the importance of hydration, regular exercise for stress reduction, and restorative sleep.  Will check labs today.  - Comprehensive metabolic panel - Hemoglobin A1c - Insulin, random  7. Osteoarthritis of right knee, primary April Norton takes naproxen 220 mg 3-4 times daily as needed for knee pain.  - VITAMIN D 25 Hydroxy (Vit-D Deficiency, Fractures)  8. Elevated LDL cholesterol level Course: Not at goal. Lipid-lowering medications: None.   Plan:  Will check lipid panel today, as per below.  - Lipid panel  9. PCOS (polycystic ovarian syndrome) She will continue to focus on protein-rich, low simple carbohydrate foods. We reviewed the importance of hydration, regular exercise for stress reduction, and restorative sleep.    Counseling . PCOS is a leading cause of menstrual irregularities and infertility. It is also associated with obesity, hirsutism (excessive hair growth on the face, chest, or back), and cardiovascular risk factors such as high cholesterol and insulin resistance. . Insulin resistance appears to play a central role.  . Women with PCOS have been shown to have impaired appetite-regulating hormones. . Metformin is one medication that can improve metabolic parameters.  . Women with polycystic ovary syndrome (PCOS) have an increased risk for cardiovascular disease (CVD) - European Journal of Preventive Cardiology.  - Anemia panel  10. Perimenopausal Women with obesity may experience more severe vasomotor symptoms at menopause than women with overweight or normal weight.  Her last cycle was in August.  - Anemia panel  11. Snores April Norton endorses snoring.  Epworth score is 8.  We will continue to monitor.  12. Polyphagia Current treatment: None. Polyphagia refers to excessive feelings of hunger.  Plan:  Start Wegovy 0.25 mg subcutaneously weekly.  If AOM not covered, will change to Ozempic for metabolic syndrome.  She will continue to focus on protein-rich, low simple carbohydrate foods. We reviewed the importance of hydration, regular exercise for stress reduction, and restorative sleep.  - Start Semaglutide-Weight Management (WEGOVY) 0.25 MG/0.5ML SOAJ; Inject 0.25 mg into the skin once a week.  Dispense: 2 mL; Refill: 0  13. Other depression, with  emotional eating Uncontrolled.  Medication: None.  April Norton eats when she is stressed, bored, and for comfort.  PHQ-9 is 10.  Plan:  Behavior modification techniques were discussed today to help deal with emotional/non-hunger eating behaviors.  14. At risk for heart disease Due to April Norton's current state of health and medical condition(s), she is at a higher risk for heart disease.  This puts the patient at much greater risk to subsequently develop  cardiopulmonary conditions that can significantly affect patient's quality of life in a negative manner.    At least 8 minutes were spent on counseling April Norton about these concerns today, and I stressed the importance of reversing risks factors of obesity, especially truncal and visceral fat, hypertension, hyperlipidemia, and pre-diabetes.  The initial goal is to lose at least 5-10% of starting weight to help reduce these risk factors.  Counseling:  Intensive lifestyle modifications were discussed with April Norton as the most appropriate first line of treatment.  she will continue to work on diet, exercise, and weight loss efforts.  We will continue to reassess these conditions on a fairly regular basis in an attempt to decrease the patient's overall morbidity and mortality.  Evidence-based interventions for health behavior change were utilized today including the discussion of self monitoring techniques, problem-solving barriers, and SMART goal setting techniques.  Specifically, regarding patient's less desirable eating habits and patterns, we employed the technique of small changes when Lovada has not been able to fully commit to her prudent nutritional plan.  15. 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 I recommend April Norton begin the structured treatment plan as follows:  She has agreed to the Category 2 Plan.  Exercise goals: No exercise has been prescribed at this time.   Behavioral modification strategies: increasing lean protein intake, decreasing simple carbohydrates, increasing vegetables, increasing water intake, decreasing liquid calories, decreasing sodium intake and increasing high fiber foods.  She was informed of the importance of frequent follow-up visits to maximize her success with intensive lifestyle  modifications for her multiple health conditions. She was informed we would discuss her lab results at her next visit unless there is a critical issue that needs to be addressed sooner. April Norton agreed to keep her next visit at the agreed upon time to discuss these results.  Objective:   Blood pressure (!) 154/82, pulse 83, temperature 98.2 F (36.8 C), temperature source Oral, height 5\' 8"  (1.727 m), weight (!) 331 lb (150.1 kg), last menstrual period 08/30/2019, SpO2 96 %. Body mass index is 50.33 kg/m.  EKG: Normal sinus rhythm, rate 84 bpm.  Indirect Calorimeter completed today shows a VO2 of 235 and a REE of 1638.  Her calculated basal metabolic rate is 7672 thus her basal metabolic rate is worse than expected.  General: Cooperative, alert, well developed, in no acute distress. HEENT: Conjunctivae and lids unremarkable. Cardiovascular: Regular rhythm.  Lungs: Normal work of breathing. Neurologic: No focal deficits.   Lab Results  Component Value Date   CREATININE 0.78 03/07/2020   BUN 17 03/07/2020   NA 141 03/07/2020   K 4.3 03/07/2020   CL 98 03/07/2020   CO2 20 03/07/2020   Lab Results  Component Value Date   ALT 16 03/07/2020   AST 16 03/07/2020   ALKPHOS 119 03/07/2020   BILITOT 0.4 03/07/2020  Lab Results  Component Value Date   HGBA1C 6.1 (H) 03/07/2020   Lab Results  Component Value Date   INSULIN 17.9 03/07/2020   Lab Results  Component Value Date   TSH 1.300 03/07/2020   Lab Results  Component Value Date   CHOL 213 (H) 03/07/2020   HDL 60 03/07/2020   LDLCALC 138 (H) 03/07/2020   TRIG 85 03/07/2020   CHOLHDL 3.6 03/07/2020   Lab Results  Component Value Date   WBC 7.0 03/07/2020   HGB 14.7 03/07/2020   HCT 44.7 03/07/2020   MCV 86 03/07/2020   PLT 280 03/07/2020   Lab Results  Component Value Date   IRON 89 03/07/2020   TIBC 392 03/07/2020   FERRITIN 205 (H) 03/07/2020   Attestation Statements:   This is the patient's first visit at  [facility]. The patient's NEW PATIENT PACKET was reviewed at length. Included in the packet: current and past health history, medications, allergies, ROS, gynecologic history (women only), surgical history, family history, social history, weight history, weight loss surgery history (for those that have had weight loss surgery), nutritional evaluation, mood and food questionnaire, PHQ9, Epworth questionnaire, sleep habits questionnaire, patient life and health improvement goals questionnaire. These will all be scanned into the patient's chart under media.   During the visit, I independently reviewed the patient's EKG, bioimpedance scale results, and indirect calorimeter results. I used this information to tailor a meal plan for the patient that will help her to lose weight and will improve her obesity-related conditions going forward. I performed a medically necessary appropriate examination and/or evaluation. I discussed the assessment and treatment plan with the patient. The patient was provided an opportunity to ask questions and all were answered. The patient agreed with the plan and demonstrated an understanding of the instructions. Labs were ordered at this visit and will be reviewed at the next visit unless more critical results need to be addressed immediately. Clinical information was updated and documented in the EMR.   I, Water quality scientist, CMA, am acting as transcriptionist for Briscoe Deutscher, DO  I have reviewed the above documentation for accuracy and completeness, and I agree with the above. Briscoe Deutscher, DO

## 2020-03-21 ENCOUNTER — Encounter (INDEPENDENT_AMBULATORY_CARE_PROVIDER_SITE_OTHER): Payer: Self-pay | Admitting: Family Medicine

## 2020-03-21 ENCOUNTER — Other Ambulatory Visit: Payer: Self-pay

## 2020-03-21 ENCOUNTER — Ambulatory Visit (INDEPENDENT_AMBULATORY_CARE_PROVIDER_SITE_OTHER): Payer: 59 | Admitting: Family Medicine

## 2020-03-21 VITALS — BP 148/82 | HR 87 | Temp 97.9°F | Ht 68.0 in | Wt 331.0 lb

## 2020-03-21 DIAGNOSIS — E8881 Metabolic syndrome: Secondary | ICD-10-CM

## 2020-03-21 DIAGNOSIS — Z6841 Body Mass Index (BMI) 40.0 and over, adult: Secondary | ICD-10-CM

## 2020-03-21 DIAGNOSIS — R948 Abnormal results of function studies of other organs and systems: Secondary | ICD-10-CM | POA: Insufficient documentation

## 2020-03-21 DIAGNOSIS — F3289 Other specified depressive episodes: Secondary | ICD-10-CM

## 2020-03-21 DIAGNOSIS — M25561 Pain in right knee: Secondary | ICD-10-CM | POA: Diagnosis not present

## 2020-03-21 DIAGNOSIS — Z9189 Other specified personal risk factors, not elsewhere classified: Secondary | ICD-10-CM | POA: Diagnosis not present

## 2020-03-21 DIAGNOSIS — E7800 Pure hypercholesterolemia, unspecified: Secondary | ICD-10-CM | POA: Insufficient documentation

## 2020-03-21 DIAGNOSIS — M1990 Unspecified osteoarthritis, unspecified site: Secondary | ICD-10-CM | POA: Insufficient documentation

## 2020-03-21 NOTE — Progress Notes (Signed)
Chief Complaint:   OBESITY April Norton is here to discuss her progress with her obesity treatment plan along with follow-up of her obesity related diagnoses.   Today's visit was #: 2 Starting weight: 331 lbs Starting date: 03/07/2020 Today's weight: 331 lbs Today's date: 03/21/2020 Total lbs lost to date: 0 Body mass index is 50.33 kg/m.   Interim History:  April Norton says she started the plan 4 days ago.  She never got the Wegovy ($800).    April Norton provided the following food recall today:  Breakfast:  Eggs. Lunch:  [employer]. Dinner:  [employer].  Current Meal Plan: the Category 2 Plan for 30% of the time.  Current Exercise Plan: None.  Assessment/Plan:   1. Metabolic syndrome Starting goal: Lose 7-10% of starting weight. She will continue to focus on protein-rich, low simple carbohydrate foods. We reviewed the importance of hydration, regular exercise for stress reduction, and restorative sleep.  We will continue to check lab work every 3 months, with 10% weight loss, or should any other concerns arise.   Plan:  Will start Ozempic 0.25 mg subcutaneously weekly.  - Start Semaglutide,0.25 or 0.5MG /DOS, (OZEMPIC, 0.25 OR 0.5 MG/DOSE,) 2 MG/1.5ML SOPN; Inject 0.5 mg into the skin once a week.  Dispense: 1.5 mL; Refill: 0  2. Right knee pain, unspecified chronicity Start Mobic 15 mg daily for knee pain.  - Start meloxicam (MOBIC) 15 MG tablet; Take 1 tablet (15 mg total) by mouth daily.  Dispense: 30 tablet; Refill: 0  3. Other depression, with emotional eating Uncontrolled.  Medication: None.  Plan:  Behavior modification techniques were discussed today to help deal with emotional/non-hunger eating behaviors.  Will start Wellbutrin XL 150 mg daily.  4. At risk for heart disease Due to Aleana's current state of health and medical condition(s), she is at a higher risk for heart disease.  This puts the patient at much greater risk to subsequently develop cardiopulmonary  conditions that can significantly affect patient's quality of life in a negative manner.    At least 8 minutes were spent on counseling April Norton about these concerns today. Counseling:  Intensive lifestyle modifications were discussed with April Norton as the most appropriate first line of treatment.  She will continue to work on diet, exercise, and weight loss efforts.  We will continue to reassess these conditions on a fairly regular basis in an attempt to decrease the patient's overall morbidity and mortality.  Evidence-based interventions for health behavior change were utilized today including the discussion of self monitoring techniques, problem-solving barriers, and SMART goal setting techniques.  Specifically, regarding patient's less desirable eating habits and patterns, we employed the technique of small changes when April Norton has not been able to fully commit to her prudent nutritional plan.  5. Class 3 severe obesity with serious comorbidity and body mass index (BMI) of 50.0 to 59.9 in adult, unspecified obesity type [facility])  Course: [name] is currently in the action stage of change. As such, her goal is to continue with weight loss efforts.   Nutrition goals: She has agreed to the Category 2 Plan.   Exercise goals: PT - water.  Behavioral modification strategies: increasing lean protein intake, decreasing simple carbohydrates, increasing vegetables and increasing water intake.  April Norton has agreed to follow-up with our clinic in 2 weeks. She was informed of the importance of frequent follow-up visits to maximize her success with intensive lifestyle modifications for her multiple health conditions.   Objective:   Blood pressure (!) 148/82, pulse  87, temperature 97.9 F (36.6 C), temperature source Oral, height 5\' 8"  (1.727 m), weight (!) 331 lb (150.1 kg), SpO2 96 %. Body mass index is 50.33 kg/m.  General: Cooperative, alert, well developed, in no acute distress. HEENT: Conjunctivae and lids  unremarkable. Cardiovascular: Regular rhythm.  Lungs: Normal work of breathing. Neurologic: No focal deficits.   Lab Results  Component Value Date   CREATININE 0.78 03/07/2020   BUN 17 03/07/2020   NA 141 03/07/2020   K 4.3 03/07/2020   CL 98 03/07/2020   CO2 20 03/07/2020   Lab Results  Component Value Date   ALT 16 03/07/2020   AST 16 03/07/2020   ALKPHOS 119 03/07/2020   BILITOT 0.4 03/07/2020   Lab Results  Component Value Date   HGBA1C 6.1 (H) 03/07/2020   Lab Results  Component Value Date   INSULIN 17.9 03/07/2020   Lab Results  Component Value Date   TSH 1.300 03/07/2020   Lab Results  Component Value Date   CHOL 213 (H) 03/07/2020   HDL 60 03/07/2020   LDLCALC 138 (H) 03/07/2020   TRIG 85 03/07/2020   CHOLHDL 3.6 03/07/2020   Lab Results  Component Value Date   WBC 7.0 03/07/2020   HGB 14.7 03/07/2020   HCT 44.7 03/07/2020   MCV 86 03/07/2020   PLT 280 03/07/2020   Lab Results  Component Value Date   IRON 89 03/07/2020   TIBC 392 03/07/2020   FERRITIN 205 (H) 03/07/2020   Attestation Statements:   Reviewed by clinician on day of visit: allergies, medications, problem list, medical history, surgical history, family history, social history, and previous encounter notes.  I, Water quality scientist, CMA, am acting as transcriptionist for Briscoe Deutscher, DO  I have reviewed the above documentation for accuracy and completeness, and I agree with the above. Briscoe Deutscher, DO

## 2020-03-25 MED ORDER — BUPROPION HCL ER (XL) 150 MG PO TB24: 150.0000 mg | ORAL_TABLET | Freq: Every day | ORAL | 0 refills | Status: DC

## 2020-03-25 MED ORDER — MELOXICAM 15 MG PO TABS: 15.0000 mg | ORAL_TABLET | Freq: Every day | ORAL | 0 refills | Status: DC

## 2020-03-25 MED ORDER — OZEMPIC (0.25 OR 0.5 MG/DOSE) 2 MG/1.5ML ~~LOC~~ SOPN: 0.5000 mg | PEN_INJECTOR | SUBCUTANEOUS | 0 refills | Status: DC

## 2020-03-26 ENCOUNTER — Telehealth (INDEPENDENT_AMBULATORY_CARE_PROVIDER_SITE_OTHER): Payer: Self-pay

## 2020-03-26 NOTE — Telephone Encounter (Signed)
[facility] [phone] [place] has question about new RX for Wellbutrin. Please give them a call.

## 2020-03-26 NOTE — Telephone Encounter (Signed)
RX is once a day per Dr Viona Gilmore, pharmacy notified

## 2020-04-02 ENCOUNTER — Telehealth (INDEPENDENT_AMBULATORY_CARE_PROVIDER_SITE_OTHER): Payer: Self-pay

## 2020-04-02 NOTE — Telephone Encounter (Signed)
Let her know this and I am okay with Rx Metformin 500 mh po daily. Make sure to ask if she has taken it in the past.

## 2020-04-02 NOTE — Telephone Encounter (Signed)
PA for Ozempic denied due to the patient not having tried and failed Metformin.  Please advise

## 2020-04-04 ENCOUNTER — Ambulatory Visit (INDEPENDENT_AMBULATORY_CARE_PROVIDER_SITE_OTHER): Payer: 59 | Admitting: Physician Assistant

## 2020-04-11 ENCOUNTER — Ambulatory Visit (INDEPENDENT_AMBULATORY_CARE_PROVIDER_SITE_OTHER): Payer: 59 | Admitting: Physician Assistant

## 2020-04-18 ENCOUNTER — Ambulatory Visit (INDEPENDENT_AMBULATORY_CARE_PROVIDER_SITE_OTHER): Payer: 59 | Admitting: Family Medicine

## 2020-04-18 ENCOUNTER — Other Ambulatory Visit: Payer: Self-pay

## 2020-04-18 ENCOUNTER — Encounter (INDEPENDENT_AMBULATORY_CARE_PROVIDER_SITE_OTHER): Payer: Self-pay | Admitting: Family Medicine

## 2020-04-18 VITALS — BP 130/81 | HR 83 | Temp 98.3°F | Ht 68.0 in | Wt 320.0 lb

## 2020-04-18 DIAGNOSIS — Z9189 Other specified personal risk factors, not elsewhere classified: Secondary | ICD-10-CM | POA: Diagnosis not present

## 2020-04-18 DIAGNOSIS — E8881 Metabolic syndrome: Secondary | ICD-10-CM | POA: Diagnosis not present

## 2020-04-18 DIAGNOSIS — M25561 Pain in right knee: Secondary | ICD-10-CM

## 2020-04-18 DIAGNOSIS — Z6841 Body Mass Index (BMI) 40.0 and over, adult: Secondary | ICD-10-CM

## 2020-04-18 DIAGNOSIS — F3289 Other specified depressive episodes: Secondary | ICD-10-CM

## 2020-04-18 DIAGNOSIS — G8929 Other chronic pain: Secondary | ICD-10-CM

## 2020-04-18 DIAGNOSIS — R7303 Prediabetes: Secondary | ICD-10-CM | POA: Diagnosis not present

## 2020-04-18 MED ORDER — BUPROPION HCL ER (XL) 150 MG PO TB24: 150.0000 mg | ORAL_TABLET | Freq: Every day | ORAL | 2 refills | Status: AC

## 2020-04-18 MED ORDER — MELOXICAM 15 MG PO TABS: 15.0000 mg | ORAL_TABLET | Freq: Every day | ORAL | 0 refills | Status: AC

## 2020-04-25 NOTE — Progress Notes (Signed)
Chief Complaint:   OBESITY April Norton is here to discuss her progress with her obesity treatment plan along with follow-up of her obesity related diagnoses.   Today's visit was #: 3 Starting weight: 331 lbs Starting date: 03/07/2020 Today's weight: 320 lbs Today's date: 04/18/2020 Total lbs lost to date: 11 lbs Body mass index is 48.66 kg/m.  Total weight loss percentage to date: -3.32%  Interim History:  April Norton is doing intermittent fasting.  She is eating from 11-8 pm.  She says she is watching her carb intake.  She has increased her water intake.  She says she has started going to the gym [employer]).  She used the recumbent bike and says she did well.  Meloxicam is helping with her knee pain, she says.  She says she feels sluggish around 4 pm.  Current Meal Plan: the Category 2 Plan for 90% of the time.  Current Exercise Plan: Cardio and walking for 45 minutes 1-2 times per week.  Assessment/Plan:   1. Chronic pain of right knee April Norton is taking meloxicam 15 mg daily for knee pain.  She says it is helping with her pain.  Plan:  Will refill meloxicam today, as per below.  - Refill meloxicam (MOBIC) 15 MG tablet; Take 1 tablet (15 mg total) by mouth daily.  Dispense: 90 tablet; Refill: 0  2. Metabolic syndrome Starting goal: Lose 7-10% of starting weight. She will continue to focus on protein-rich, low simple carbohydrate foods. We reviewed the importance of hydration, regular exercise for stress reduction, and restorative sleep.  We will continue to check lab work every 3 months, with 10% weight loss, or should any other concerns arise.  3. Prediabetes Not at goal. Goal is HgbA1c < 5.7.  Medication: None.    Plan:  She will continue to focus on protein-rich, low simple carbohydrate foods. We reviewed the importance of hydration, regular exercise for stress reduction, and restorative sleep.   Lab Results  Component Value Date   HGBA1C 6.1 (H) 03/07/2020   Lab Results   Component Value Date   INSULIN 17.9 03/07/2020   4. Other depression, with emotional eating Not at goal. Medication: Wellbutrin XL 150 mg daily.  Plan:  Continue Wellbutrin.  Will refill today, as per below.  Behavior modification techniques were discussed today to help deal with emotional/non-hunger eating behaviors.  - Refill buPROPion (WELLBUTRIN XL) 150 MG 24 hr tablet; Take 1 tablet (150 mg total) by mouth daily.  Dispense: 30 tablet; Refill: 2  5. At risk for heart disease Due to Tal's current state of health and medical condition(s), she is at a higher risk for heart disease.  This puts the patient at much greater risk to subsequently develop cardiopulmonary conditions that can significantly affect patient's quality of life in a negative manner.    At least 8 minutes were spent on counseling Bao about these concerns today. Evidence-based interventions for health behavior change were utilized today including the discussion of self monitoring techniques, problem-solving barriers, and SMART goal setting techniques.  Specifically, regarding patient's less desirable eating habits and patterns, we employed the technique of small changes when Mylissa has not been able to fully commit to her prudent nutritional plan.  6. Obesity, current BMI 48.7  Course: April Norton is currently in the action stage of change. As such, her goal is to continue with weight loss efforts.   Nutrition goals: She has agreed to following a lower carbohydrate, vegetable and lean protein rich diet plan (  25-50 grams of carbs).   Exercise goals: As is.  Behavioral modification strategies: increasing lean protein intake, decreasing simple carbohydrates, increasing vegetables and increasing water intake.  April Norton has agreed to follow-up with our clinic in 3 weeks. She was informed of the importance of frequent follow-up visits to maximize her success with intensive lifestyle modifications for her multiple health conditions.    Objective:   Blood pressure 130/81, pulse 83, temperature 98.3 F (36.8 C), temperature source Oral, height 5\' 8"  (1.727 m), weight (!) 320 lb (145.2 kg), SpO2 96 %. Body mass index is 48.66 kg/m.  General: Cooperative, alert, well developed, in no acute distress. HEENT: Conjunctivae and lids unremarkable. Cardiovascular: Regular rhythm.  Lungs: Normal work of breathing. Neurologic: No focal deficits.   Lab Results  Component Value Date   CREATININE 0.78 03/07/2020   BUN 17 03/07/2020   NA 141 03/07/2020   K 4.3 03/07/2020   CL 98 03/07/2020   CO2 20 03/07/2020   Lab Results  Component Value Date   ALT 16 03/07/2020   AST 16 03/07/2020   ALKPHOS 119 03/07/2020   BILITOT 0.4 03/07/2020   Lab Results  Component Value Date   HGBA1C 6.1 (H) 03/07/2020   Lab Results  Component Value Date   INSULIN 17.9 03/07/2020   Lab Results  Component Value Date   TSH 1.300 03/07/2020   Lab Results  Component Value Date   CHOL 213 (H) 03/07/2020   HDL 60 03/07/2020   LDLCALC 138 (H) 03/07/2020   TRIG 85 03/07/2020   CHOLHDL 3.6 03/07/2020   Lab Results  Component Value Date   WBC 7.0 03/07/2020   HGB 14.7 03/07/2020   HCT 44.7 03/07/2020   MCV 86 03/07/2020   PLT 280 03/07/2020   Lab Results  Component Value Date   IRON 89 03/07/2020   TIBC 392 03/07/2020   FERRITIN 205 (H) 03/07/2020   Attestation Statements:   Reviewed by clinician on day of visit: allergies, medications, problem list, medical history, surgical history, family history, social history, and previous encounter notes.  I, Water quality scientist, CMA, am acting as transcriptionist for Briscoe Deutscher, DO  I have reviewed the above documentation for accuracy and completeness, and I agree with the above. Briscoe Deutscher, DO

## 2020-05-16 ENCOUNTER — Ambulatory Visit (INDEPENDENT_AMBULATORY_CARE_PROVIDER_SITE_OTHER): Payer: 59 | Admitting: Family Medicine

## 2021-08-27 ENCOUNTER — Encounter (INDEPENDENT_AMBULATORY_CARE_PROVIDER_SITE_OTHER): Payer: Self-pay
